# Patient Record
Sex: Male | Born: 1965 | Race: White | Hispanic: No | Marital: Married | Smoking: Never smoker
Health system: Southern US, Community
[De-identification: ages and names within clinical notes are randomized; demographics above are authoritative.]

## PROBLEM LIST (undated history)

## (undated) DIAGNOSIS — R51 Headache: Secondary | ICD-10-CM

## (undated) DIAGNOSIS — C4491 Basal cell carcinoma of skin, unspecified: Secondary | ICD-10-CM

## (undated) DIAGNOSIS — F419 Anxiety disorder, unspecified: Secondary | ICD-10-CM

## (undated) DIAGNOSIS — D1802 Hemangioma of intracranial structures: Secondary | ICD-10-CM

## (undated) HISTORY — DX: Anxiety disorder, unspecified: F41.9

## (undated) HISTORY — DX: Basal cell carcinoma of skin, unspecified: C44.91

## (undated) HISTORY — PX: SKIN CANCER EXCISION: SHX779

## (undated) HISTORY — DX: Hemangioma of intracranial structures: D18.02

## (undated) HISTORY — DX: Headache: R51

---

## 2008-05-18 ENCOUNTER — Encounter: Admission: RE | Admit: 2008-05-18 | Discharge: 2008-05-18 | Payer: Self-pay | Admitting: Orthopedic Surgery

## 2010-03-22 ENCOUNTER — Emergency Department (HOSPITAL_COMMUNITY): Admission: EM | Admit: 2010-03-22 | Discharge: 2010-03-22 | Payer: Self-pay | Admitting: Family Medicine

## 2011-07-06 ENCOUNTER — Observation Stay (HOSPITAL_COMMUNITY)
Admission: EM | Admit: 2011-07-06 | Discharge: 2011-07-06 | Disposition: A | Discharge status: Home / Self Care | Payer: BC Managed Care – PPO | Source: Ambulatory Visit | Attending: Emergency Medicine | Admitting: Emergency Medicine

## 2011-07-06 ENCOUNTER — Observation Stay (HOSPITAL_COMMUNITY): Payer: BC Managed Care – PPO

## 2011-07-06 ENCOUNTER — Emergency Department (HOSPITAL_COMMUNITY): Payer: BC Managed Care – PPO

## 2011-07-06 ENCOUNTER — Encounter (HOSPITAL_COMMUNITY): Payer: Self-pay | Admitting: Radiology

## 2011-07-06 DIAGNOSIS — R079 Chest pain, unspecified: Principal | ICD-10-CM | POA: Insufficient documentation

## 2011-07-06 LAB — CBC
HCT: 40.5 % (ref 39.0–52.0)
Hemoglobin: 14.3 g/dL (ref 13.0–17.0)
MCHC: 35.3 g/dL (ref 30.0–36.0)
RBC: 4.62 MIL/uL (ref 4.22–5.81)
WBC: 5.8 10*3/uL (ref 4.0–10.5)

## 2011-07-06 LAB — BASIC METABOLIC PANEL
BUN: 20 mg/dL (ref 6–23)
CO2: 27 mEq/L (ref 19–32)
Chloride: 104 mEq/L (ref 96–112)
Glucose, Bld: 110 mg/dL — ABNORMAL HIGH (ref 70–99)
Potassium: 3.9 mEq/L (ref 3.5–5.1)
Sodium: 138 mEq/L (ref 135–145)

## 2011-07-06 LAB — POCT I-STAT TROPONIN I: Troponin i, poc: 0.01 ng/mL (ref 0.00–0.08)

## 2011-07-06 MED ORDER — IOHEXOL 350 MG/ML SOLN
80.0000 mL | Freq: Once | INTRAVENOUS | Status: AC | PRN
  Administered 2011-07-06: 80 mL via INTRAVENOUS

## 2013-08-20 ENCOUNTER — Emergency Department (HOSPITAL_COMMUNITY): Payer: BC Managed Care – PPO

## 2013-08-20 ENCOUNTER — Encounter (HOSPITAL_COMMUNITY): Payer: Self-pay | Admitting: Emergency Medicine

## 2013-08-20 ENCOUNTER — Other Ambulatory Visit: Payer: Self-pay

## 2013-08-20 ENCOUNTER — Emergency Department (HOSPITAL_COMMUNITY)
Admission: EM | Admit: 2013-08-20 | Discharge: 2013-08-20 | Disposition: A | Discharge status: Home / Self Care | Payer: BC Managed Care – PPO | Attending: Emergency Medicine | Admitting: Emergency Medicine

## 2013-08-20 DIAGNOSIS — R252 Cramp and spasm: Secondary | ICD-10-CM

## 2013-08-20 DIAGNOSIS — R51 Headache: Secondary | ICD-10-CM | POA: Insufficient documentation

## 2013-08-20 DIAGNOSIS — R519 Headache, unspecified: Secondary | ICD-10-CM

## 2013-08-20 DIAGNOSIS — D1802 Hemangioma of intracranial structures: Secondary | ICD-10-CM | POA: Insufficient documentation

## 2013-08-20 LAB — CBC
HCT: 42.4 % (ref 39.0–52.0)
Hemoglobin: 15.1 g/dL (ref 13.0–17.0)
MCH: 31.7 pg (ref 26.0–34.0)
MCHC: 35.6 g/dL (ref 30.0–36.0)
MCV: 89.1 fL (ref 78.0–100.0)
Platelets: 243 10*3/uL (ref 150–400)
RBC: 4.76 MIL/uL (ref 4.22–5.81)
RDW: 12.6 % (ref 11.5–15.5)
WBC: 5.4 10*3/uL (ref 4.0–10.5)

## 2013-08-20 LAB — POCT I-STAT, CHEM 8
BUN: 23 mg/dL (ref 6–23)
Calcium, Ion: 1.16 mmol/L (ref 1.12–1.23)
Chloride: 104 mEq/L (ref 96–112)
Creatinine, Ser: 1.2 mg/dL (ref 0.50–1.35)
Glucose, Bld: 107 mg/dL — ABNORMAL HIGH (ref 70–99)
HCT: 45 % (ref 39.0–52.0)
Hemoglobin: 15.3 g/dL (ref 13.0–17.0)
Potassium: 3.9 mEq/L (ref 3.5–5.1)
Sodium: 140 mEq/L (ref 135–145)
TCO2: 23 mmol/L (ref 0–100)

## 2013-08-20 LAB — GLUCOSE, CAPILLARY: Glucose-Capillary: 112 mg/dL — ABNORMAL HIGH (ref 70–99)

## 2013-08-20 LAB — BASIC METABOLIC PANEL
BUN: 23 mg/dL (ref 6–23)
CO2: 25 mEq/L (ref 19–32)
Calcium: 9.2 mg/dL (ref 8.4–10.5)
Chloride: 101 mEq/L (ref 96–112)
Creatinine, Ser: 0.96 mg/dL (ref 0.50–1.35)
GFR calc Af Amer: >90 mL/min (ref >90)
GFR calc non Af Amer: >90 mL/min (ref >90)
Glucose, Bld: 107 mg/dL — ABNORMAL HIGH (ref 70–99)
Potassium: 3.9 mEq/L (ref 3.5–5.1)
Sodium: 137 mEq/L (ref 135–145)

## 2013-08-20 LAB — MAGNESIUM: Magnesium: 2 mg/dL (ref 1.5–2.5)

## 2013-08-20 LAB — URINALYSIS, ROUTINE W REFLEX MICROSCOPIC
Bilirubin Urine: NEGATIVE
Glucose, UA: NEGATIVE mg/dL
Hgb urine dipstick: NEGATIVE
Ketones, ur: NEGATIVE mg/dL
Leukocytes, UA: NEGATIVE
Specific Gravity, Urine: 1.026 (ref 1.005–1.030)
Urobilinogen, UA: 0.2 mg/dL (ref 0.0–1.0)

## 2013-08-20 LAB — PHOSPHORUS: Phosphorus: 2.8 mg/dL (ref 2.3–4.6)

## 2013-08-20 MED ORDER — GADOBENATE DIMEGLUMINE 529 MG/ML IV SOLN
20.0000 mL | Freq: Once | INTRAVENOUS | Status: AC
  Administered 2013-08-20: 20 mL via INTRAVENOUS

## 2013-08-20 MED ORDER — SODIUM CHLORIDE 0.9 % IV BOLUS (SEPSIS)
1000.0000 mL | Freq: Once | INTRAVENOUS | Status: AC
  Administered 2013-08-20: 1000 mL via INTRAVENOUS

## 2013-08-20 MED ORDER — LORAZEPAM 2 MG/ML IJ SOLN
1.0000 mg | Freq: Once | INTRAMUSCULAR | Status: AC
  Administered 2013-08-20: 1 mg via INTRAVENOUS
  Filled 2013-08-20: qty 1

## 2013-08-20 MED ORDER — ACETAMINOPHEN 500 MG PO TABS
1000.0000 mg | ORAL_TABLET | Freq: Once | ORAL | Status: AC
  Administered 2013-08-20: 1000 mg via ORAL
  Filled 2013-08-20: qty 2

## 2013-08-20 NOTE — ED Notes (Signed)
MD at bedside. 

## 2013-08-20 NOTE — ED Provider Notes (Addendum)
CSN: [id number]     Arrival date & time 08/20/13  1308 History   First MD Initiated Contact with Patient 08/20/13 [phone]     Chief Complaint  Patient presents with  . Dizziness   (Consider location/radiation/quality/duration/timing/severity/associated sxs/prior Treatment) Patient is a 47 y.o. male presenting with headaches.  Headache Pain location:  Generalized Quality:  Dull Radiates to:  Does not radiate Pain severity now: mild. Onset quality:  Gradual Duration:  2 weeks Timing:  Intermittent Progression:  Unchanged Chronicity:  New Relieved by:  Nothing Worsened by:  Nothing tried Associated symptoms: sinus pressure and visual change   Associated symptoms: no abdominal pain, no congestion, no cough, no diarrhea, no fever, no nausea, no photophobia and no vomiting   Associated symptoms comment:  Muscle cramping.   History reviewed. No pertinent past medical history. History reviewed. No pertinent past surgical history. No family history on file. History  Substance Use Topics  . Smoking status: Never Smoker   . Smokeless tobacco: Not on file  . Alcohol Use: Yes    Review of Systems  Constitutional: Negative for fever.  HENT: Positive for sinus pressure. Negative for congestion.   Eyes: Negative for photophobia.  Respiratory: Negative for cough and shortness of breath.   Cardiovascular: Negative for chest pain.  Gastrointestinal: Negative for nausea, vomiting, abdominal pain and diarrhea.  Neurological: Positive for headaches.  All other systems reviewed and are negative.    Allergies  Review of patient's allergies indicates no known allergies.  Home Medications   Current Outpatient Rx  Name  Route  Sig  Dispense  Refill  . diphenhydrAMINE (SOMINEX) 25 MG tablet   Oral   Take 25 mg by mouth at bedtime as needed for allergies.         Marland Kitchen ibuprofen (ADVIL,MOTRIN) 200 MG tablet   Oral   Take 400 mg by mouth every 6 (six) hours as needed for pain.           BP 143/90  Pulse 90  Temp(Src) 98.3 F (36.8 C)  Resp 16  Ht 7\' 6"  (2.286 m)  Wt 198 lb (89.812 kg)  BMI 17.19 kg/m2  SpO2 100% Physical Exam  Nursing note and vitals reviewed. Constitutional: He is oriented to person, place, and time. He appears well-developed and well-nourished. No distress.  HENT:  Head: Normocephalic and atraumatic.  Mouth/Throat: Oropharynx is clear and moist.  Eyes: Conjunctivae are normal. Pupils are equal, round, and reactive to light. No scleral icterus.  Neck: Neck supple.  Cardiovascular: Normal rate, regular rhythm, normal heart sounds and intact distal pulses.   No murmur heard. Pulmonary/Chest: Effort normal and breath sounds normal. No stridor. No respiratory distress. He has no wheezes. He has no rales.  Abdominal: Soft. He exhibits no distension. There is no tenderness. There is no rebound and no guarding.  Musculoskeletal: Normal range of motion. He exhibits no edema.  Neurological: He is alert and oriented to person, place, and time. No cranial nerve deficit or sensory deficit. He displays a negative Romberg sign. Coordination and gait normal. GCS eye subscore is 4. GCS verbal subscore is 5. GCS motor subscore is 6.  Reflex Scores:      Patellar reflexes are 2+ on the right side and 2+ on the left side. Skin: Skin is warm and dry. No rash noted.  Psychiatric: He has a normal mood and affect. His behavior is normal.    ED Course  Procedures (including critical care time) Labs Review Labs Reviewed  BASIC METABOLIC PANEL - Abnormal; Notable for the following:    Glucose, Bld 107 (*)    All other components within normal limits  GLUCOSE, CAPILLARY - Abnormal; Notable for the following:    Glucose-Capillary 112 (*)    All other components within normal limits  POCT I-STAT, CHEM 8 - Abnormal; Notable for the following:    Glucose, Bld 107 (*)    All other components within normal limits  CBC  URINALYSIS, ROUTINE W REFLEX MICROSCOPIC   MAGNESIUM  PHOSPHORUS   Imaging Review Dg Chest 2 View  08/20/2013   CLINICAL DATA:  Shortness of breath, dizziness  EXAM: CHEST  2 VIEW  COMPARISON:  None.  FINDINGS: Lungs are clear. No pleural effusion or pneumothorax.  The heart is normal in size.  Visualized osseous structures are within normal limits.  IMPRESSION: No evidence of acute cardiopulmonary disease.   Electronically Signed   By: Charline Bills M.D.   On: 08/20/2013 11:25   Ct Head Wo Contrast  08/20/2013   CLINICAL DATA:  Shortness of breath, cramping, dizziness, blurred vision, headache, symptoms for 1 week  EXAM: CT HEAD WITHOUT CONTRAST  TECHNIQUE: Contiguous axial images were obtained from the base of the skull through the vertex without intravenous contrast.  COMPARISON:  None  FINDINGS: Cavum septum pellucidum, normal variant.  Otherwise normal ventricular morphology.  No midline shift or mass effect.  Tiny foci of intermediate to high attenuation are identified in the right frontal lobe, largest 3 mm diameter image 20.  These are indeterminate and could represent tiny dystrophic calcifications or tiny foci of hemorrhage.  No additional intracranial hemorrhage, mass lesion or evidence acute infarction identified.  No extra-axial fluid collections.  Bones and sinuses unremarkable.  IMPRESSION: Tiny foci of intermediate high attenuation in right frontal lobe up to 3 mm in size, nonspecific, could represent foci of dystrophic calcification or hemorrhage.  Recommend MR imaging with and without contrast for further evaluation.  Findings discussed with Dr. Loretha Stapler on 08/20/2013 at 1159 hr.   Electronically Signed   By: Ulyses Southward M.D.   On: 08/20/2013 12:00   Mr Laqueta Jean WU Contrast  08/20/2013   CLINICAL DATA:  Cramping of the arms and legs. Blurred vision. Headache. Abnormal head CT.  EXAM: MRI HEAD WITHOUT AND WITH CONTRAST  TECHNIQUE: Multiplanar, multiecho pulse sequences of the brain and surrounding structures were obtained  according to standard protocol without and with intravenous contrast  CONTRAST:  20mL MULTIHANCE GADOBENATE DIMEGLUMINE 529 MG/ML IV SOLN  COMPARISON:  Head CT same day  FINDINGS: Diffusion imaging does not show any acute or subacute infarction. The brainstem and cerebellum are normal. The left cerebral hemisphere is normal.  In the right frontal region, the patient has a fairly large developmental venous anomaly, most consistent with venous angioma. The areas of hyperdensity on CT are within that region and could actually represent blood in some of the larger venous channels rather than parenchymal hemorrhage or calcification. Gradient echo imaging does not show evidence of old or acute parenchymal hemorrhage or calcification.  There is no mass lesion, hydrocephalus or extra-axial collection. The pituitary gland is normal. Sinuses, middle ears and mastoids are clear.  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 I believe the CT findings relate to hyperdensity related to some of the larger venous channels rather than acute intraparenchymal hemorrhage. The MRI scan does not show evidence of intraparenchymal hemorrhage, calcification or thrombosis  of the venous channels. Therefore, this finding may be incidental to the patient's presentation. For thorough FOLLOW UP, one might consider repeat CT scan in 2-3 months to see if the appearance has changed.   Electronically Signed   By: Paulina Fusi M.D.   On: 08/20/2013 14:13  All radiology studies independently viewed by me.     EKG Interpretation   None     EKG - NSR, rate 90, normal axis, normal intervals, no ST/T changes, no significant changes from prior.   MDM   1. Headache   2. Muscle cramps   3. Venous angioma of brain    47 year old male presenting with one week worth of intermittent headaches, blurry vision, and muscle cramping. Muscle cramps severe this morning which  prompted his ED visit.  Normal neurologic exam. Visual acuity within normal limits, grossly equivalent.  CT prompted the MRI, which should be venous angiomas. Remainder of his workup was unremarkable. He felt better after IV fluids, Ativan, Tylenol.  I think his symptoms are most likely stress related, but he will need further outpatient followup.  Will also refer to neurology for followup of his angiomas.    Candyce Churn, MD 08/20/13 1536  Candyce Churn, MD 08/20/13 [phone]

## 2013-08-20 NOTE — ED Notes (Signed)
Patient transported to MRI 

## 2013-08-20 NOTE — ED Notes (Addendum)
Pt states cramping up arms and legs, with intermittent blurry vision. Headache pain all over, denies N/V. Dizziness for the last week which as gotten worse. Denies history of vertigo. Feels a little SOB.

## 2013-08-20 NOTE — ED Notes (Signed)
[phone] Silva Bandy (wife)

## 2013-08-20 NOTE — ED Notes (Signed)
Pt transported to radiology.

## 2013-08-20 NOTE — ED Notes (Signed)
Pt expressed anxiety about being in the closed space MRI.

## 2013-08-20 NOTE — ED Notes (Signed)
Pt returned from radiology.

## 2013-08-20 NOTE — ED Notes (Signed)
Sob and dizzy this am states has been crampy the last week  Some blurred vision and h/a x a week or more

## 2013-08-20 NOTE — ED Notes (Signed)
Pt back from CT

## 2013-08-21 ENCOUNTER — Telehealth: Payer: Self-pay | Admitting: General Practice

## 2013-08-21 NOTE — Telephone Encounter (Signed)
Yes

## 2013-08-21 NOTE — Telephone Encounter (Signed)
Pt was discharged from the hospital last night, with instructions to follow up with his PCP immediately. He states that he needs a referral to see a neurologist. He has not seen Dr. Cato Mulligan in over 3 years, would you be willing to work him in for a post hosp or a new pt appt? Please advise.

## 2013-08-22 ENCOUNTER — Encounter (HOSPITAL_COMMUNITY): Payer: Self-pay | Admitting: Emergency Medicine

## 2013-08-22 ENCOUNTER — Emergency Department (HOSPITAL_COMMUNITY)
Admission: EM | Admit: 2013-08-22 | Discharge: 2013-08-22 | Disposition: A | Discharge status: Home / Self Care | Payer: BC Managed Care – PPO | Attending: Emergency Medicine | Admitting: Emergency Medicine

## 2013-08-22 ENCOUNTER — Emergency Department (HOSPITAL_COMMUNITY)
Admission: EM | Admit: 2013-08-22 | Discharge: 2013-08-22 | Disposition: A | Discharge status: Home / Self Care | Payer: BC Managed Care – PPO | Source: Home / Self Care

## 2013-08-22 ENCOUNTER — Emergency Department (HOSPITAL_COMMUNITY): Payer: BC Managed Care – PPO

## 2013-08-22 DIAGNOSIS — R51 Headache: Secondary | ICD-10-CM | POA: Insufficient documentation

## 2013-08-22 DIAGNOSIS — R519 Headache, unspecified: Secondary | ICD-10-CM

## 2013-08-22 DIAGNOSIS — D1802 Hemangioma of intracranial structures: Secondary | ICD-10-CM | POA: Insufficient documentation

## 2013-08-22 DIAGNOSIS — F411 Generalized anxiety disorder: Secondary | ICD-10-CM | POA: Insufficient documentation

## 2013-08-22 DIAGNOSIS — F419 Anxiety disorder, unspecified: Secondary | ICD-10-CM

## 2013-08-22 LAB — BASIC METABOLIC PANEL
CO2: 24 mEq/L (ref 19–32)
Chloride: 101 mEq/L (ref 96–112)
Creatinine, Ser: 1.05 mg/dL (ref 0.50–1.35)
GFR calc Af Amer: >90 mL/min (ref >90)
Glucose, Bld: 90 mg/dL (ref 70–99)
Potassium: 3.8 mEq/L (ref 3.5–5.1)
Sodium: 137 mEq/L (ref 135–145)

## 2013-08-22 MED ORDER — SODIUM CHLORIDE 0.9 % IV BOLUS (SEPSIS)
1000.0000 mL | Freq: Once | INTRAVENOUS | Status: AC
  Administered 2013-08-22: 1000 mL via INTRAVENOUS

## 2013-08-22 MED ORDER — DIPHENHYDRAMINE HCL 50 MG/ML IJ SOLN
25.0000 mg | Freq: Once | INTRAMUSCULAR | Status: AC
  Administered 2013-08-22: 25 mg via INTRAVENOUS
  Filled 2013-08-22: qty 1

## 2013-08-22 MED ORDER — METOCLOPRAMIDE HCL 5 MG/ML IJ SOLN
10.0000 mg | Freq: Once | INTRAMUSCULAR | Status: AC
  Administered 2013-08-22: 10 mg via INTRAVENOUS
  Filled 2013-08-22: qty 2

## 2013-08-22 MED ORDER — LORAZEPAM 2 MG/ML IJ SOLN
1.0000 mg | Freq: Once | INTRAMUSCULAR | Status: AC
  Administered 2013-08-22: 1 mg via INTRAVENOUS
  Filled 2013-08-22: qty 1

## 2013-08-22 MED ORDER — SODIUM CHLORIDE 0.9 % IV SOLN: INTRAVENOUS | Status: DC

## 2013-08-22 MED ORDER — HYDROCODONE-ACETAMINOPHEN 5-325 MG PO TABS: 2.0000 | ORAL_TABLET | ORAL | Status: DC | PRN

## 2013-08-22 MED ORDER — ALPRAZOLAM 0.25 MG PO TABS: 0.2500 mg | ORAL_TABLET | Freq: Three times a day (TID) | ORAL | Status: AC | PRN

## 2013-08-22 NOTE — ED Notes (Signed)
Family at bedside. 

## 2013-08-22 NOTE — ED Provider Notes (Signed)
CSN: [id number]     Arrival date & time 08/22/13  1305 History   First MD Initiated Contact with Patient 08/22/13 1325     Chief Complaint  Patient presents with  . Headache   (Consider location/radiation/quality/duration/timing/severity/associated sxs/prior Treatment) Patient is a 47 y.o. male presenting with headaches. The history is provided by the patient and a parent.  Headache  patient here complaining of headache that began 3 days ago. Seen here in the department for same and had a head CT followed by brain MRI which showed a right-sided venous angioma without evidence of hemorrhage. Patient denies any seizure activity but does note shaking of his upper and lower limbs. He was conscious throughout his entire episodes and states that he has been more anxious since the diagnosis. This was collaborated by his mother. Denies any fever or neck pain. Denies any nausea vomiting. Denies any vision loss. Has used Motrin without relief. Denies any syncopal or near syncopal events. Has not followup with the neurologist yet as he was instructed  History reviewed. No pertinent past medical history. History reviewed. No pertinent past surgical history. No family history on file. History  Substance Use Topics  . Smoking status: Never Smoker   . Smokeless tobacco: Not on file  . Alcohol Use: Yes    Review of Systems  Neurological: Positive for headaches.  All other systems reviewed and are negative.    Allergies  Review of patient's allergies indicates no known allergies.  Home Medications   Current Outpatient Rx  Name  Route  Sig  Dispense  Refill  . diphenhydrAMINE (SOMINEX) 25 MG tablet   Oral   Take 25 mg by mouth at bedtime as needed for allergies.         Marland Kitchen ibuprofen (ADVIL,MOTRIN) 200 MG tablet   Oral   Take 400 mg by mouth every 6 (six) hours as needed for pain.          BP 132/79  Pulse 80  Temp(Src) 98.2 F (36.8 C) (Oral)  Resp 16  SpO2 97% Physical Exam   Nursing note and vitals reviewed. Constitutional: He is oriented to person, place, and time. He appears well-developed and well-nourished.  Non-toxic appearance. No distress.  HENT:  Head: Normocephalic and atraumatic.  Eyes: Conjunctivae, EOM and lids are normal. Pupils are equal, round, and reactive to light.  Neck: Normal range of motion. Neck supple. No tracheal deviation present. No mass present.  Cardiovascular: Normal rate, regular rhythm and normal heart sounds.  Exam reveals no gallop.   No murmur heard. Pulmonary/Chest: Effort normal and breath sounds normal. No stridor. No respiratory distress. He has no decreased breath sounds. He has no wheezes. He has no rhonchi. He has no rales.  Abdominal: Soft. Normal appearance and bowel sounds are normal. He exhibits no distension. There is no tenderness. There is no rebound and no CVA tenderness.  Musculoskeletal: Normal range of motion. He exhibits no edema and no tenderness.  Neurological: He is alert and oriented to person, place, and time. He has normal strength. No cranial nerve deficit or sensory deficit. GCS eye subscore is 4. GCS verbal subscore is 5. GCS motor subscore is 6.  Skin: Skin is warm and dry. No abrasion and no rash noted.  Psychiatric: His speech is normal and behavior is normal. His mood appears anxious.    ED Course  Procedures (including critical care time) Labs Review Labs Reviewed  BASIC METABOLIC PANEL   Imaging Review Mr Lodema Pilot  Contrast  08/20/2013   CLINICAL DATA:  Cramping of the arms and legs. Blurred vision. Headache. Abnormal head CT.  EXAM: MRI HEAD WITHOUT AND WITH CONTRAST  TECHNIQUE: Multiplanar, multiecho pulse sequences of the brain and surrounding structures were obtained according to standard protocol without and with intravenous contrast  CONTRAST:  20mL MULTIHANCE GADOBENATE DIMEGLUMINE 529 MG/ML IV SOLN  COMPARISON:  Head CT same day  FINDINGS: Diffusion imaging does not show any acute or  subacute infarction. The brainstem and cerebellum are normal. The left cerebral hemisphere is normal.  In the right frontal region, the patient has a fairly large developmental venous anomaly, most consistent with venous angioma. The areas of hyperdensity on CT are within that region and could actually represent blood in some of the larger venous channels rather than parenchymal hemorrhage or calcification. Gradient echo imaging does not show evidence of old or acute parenchymal hemorrhage or calcification.  There is no mass lesion, hydrocephalus or extra-axial collection. The pituitary gland is normal. Sinuses, middle ears and mastoids are clear.  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 I believe the CT findings relate to hyperdensity related to some of the larger venous channels rather than acute intraparenchymal hemorrhage. The MRI scan does not show evidence of intraparenchymal hemorrhage, calcification or thrombosis of the venous channels. Therefore, this finding may be incidental to the patient's presentation. For thorough FOLLOW UP, one might consider repeat CT scan in 2-3 months to see if the appearance has changed.   Electronically Signed   By: Paulina Fusi M.D.   On: 08/20/2013 14:13    EKG Interpretation   None       MDM  No diagnosis found. Patient given medications for his headache here and he feels better. Neurological assessment stable. Repeat head CT remains negative. Patient will be given neurology referral    Toy Baker, MD 08/22/13 1451

## 2013-08-22 NOTE — ED Notes (Signed)
Patient transported to CT 

## 2013-08-22 NOTE — ED Notes (Signed)
Pt. Stated, i started having a bad headache on Thursday and pain on my left lower back with my legs a little numb.  Came to the hospital on Thursday and they said I had some spots in my brain and i need to see a nuro Dr.  Last night I had some shaking in my arms like a seizure but not and I have a bad headache.

## 2013-08-25 ENCOUNTER — Ambulatory Visit (INDEPENDENT_AMBULATORY_CARE_PROVIDER_SITE_OTHER): Payer: BC Managed Care – PPO | Admitting: Neurology

## 2013-08-25 ENCOUNTER — Encounter: Payer: Self-pay | Admitting: Neurology

## 2013-08-25 VITALS — BP 129/85 | HR 106 | Ht 76.0 in | Wt 204.0 lb

## 2013-08-25 DIAGNOSIS — D1802 Hemangioma of intracranial structures: Secondary | ICD-10-CM

## 2013-08-25 DIAGNOSIS — R51 Headache: Secondary | ICD-10-CM

## 2013-08-25 DIAGNOSIS — R519 Headache, unspecified: Secondary | ICD-10-CM | POA: Insufficient documentation

## 2013-08-25 HISTORY — DX: Headache: R51

## 2013-08-25 HISTORY — DX: Hemangioma of intracranial structures: D18.02

## 2013-08-25 MED ORDER — PREDNISONE 5 MG PO TABS: ORAL_TABLET | ORAL | Status: DC

## 2013-08-25 NOTE — Progress Notes (Signed)
Reason for visit: Headache  Robert Elliott. is a 47 y.o. male  History of present illness:  Robert Elliott is a 47 year old right-handed white male with a history of a headache that began on 08/20/2013. The patient has noted a headache in the left occipital area, and he indicates that the pain began in the head, and then eventually spread into the left neck area. The patient denies any pain down into the shoulder or arm. The patient has a sensation of increased tension of both arms, without true weakness. The patient has had some blurring of vision with the headache, but he denies photophobia or phonophobia. The patient has not had any visual loss or problems with nausea or vomiting. The patient denies balance issues or problems controlling the bowels or the bladder. The patient does report some neck stiffness. The patient went to the emergency room, and a CT scan of brain showed a right frontal abnormality, and MRI evaluation was then done. This shows evidence of a right frontal venous angioma without evidence of acute or chronic hemorrhage. The patient had some problems with tremors of both arms lasting 20-30 minutes the day after he went to the emergency room on October 9. The patient has returned to the emergency room again on 08/22/2013, and he received medications for the headache. The patient is sent to this office for an evaluation. The patient indicates that Advil, Xanax, and hydrocodone do help the headache. The headache persists, but the patient is able to function with the headache. The patient describes the headache as a dull achy pain, without scalp tenderness. The patient denies any history of frequent headaches previously.  Past Medical History  Diagnosis Date  . Headache(784.0) 08/25/2013  . Venous angioma of brain 08/25/2013    Right frontal    History reviewed. No pertinent past surgical history.  History reviewed. No pertinent family history.  Social history:  reports that he has  never smoked. He has never used smokeless tobacco. He reports that he drinks alcohol. He reports that he does not use illicit drugs.  Medications:  Current Outpatient Prescriptions on File Prior to Visit  Medication Sig Dispense Refill  . ALPRAZolam (XANAX) 0.25 MG tablet Take 1 tablet (0.25 mg total) by mouth 3 (three) times daily as needed for anxiety.  12 tablet  0  . HYDROcodone-acetaminophen (NORCO/VICODIN) 5-325 MG per tablet Take 2 tablets by mouth every 4 (four) hours as needed for pain.  10 tablet  0  . ibuprofen (ADVIL,MOTRIN) 200 MG tablet Take 400 mg by mouth every 6 (six) hours as needed for pain.       No current facility-administered medications on file prior to visit.     No Known Allergies  ROS:  Out of a complete 14 system review of symptoms, the patient complains only of the following symptoms, and all other reviewed systems are negative.  Loss of vision, blurred vision Muscle cramps, achy muscles Headache  Blood pressure 129/85, pulse 106, height 6\' 4"  (1.93 m), weight 204 lb (92.534 kg).  Physical Exam  General: The patient is alert and cooperative at the time of the examination.  Head: Pupils are equal, round, and reactive to light. Discs are flat bilaterally.  Neck: The neck is supple, no carotid bruits are noted.  Respiratory: The respiratory examination is clear.  Cardiovascular: The cardiovascular examination reveals a regular rate and rhythm, no obvious murmurs or rubs are noted.  Neuromuscular: Range of movement of the cervical spine is full  and normal.  Skin: Extremities are without significant edema.  Neurologic Exam  Mental status:  Cranial nerves: Facial symmetry is present. There is good sensation of the face to pinprick and soft touch bilaterally. The strength of the facial muscles and the muscles to head turning and shoulder shrug are normal bilaterally. Speech is well enunciated, no aphasia or dysarthria is noted. Extraocular movements  are full. Visual fields are full.  Motor: The motor testing reveals 5 over 5 strength of all 4 extremities. Good symmetric motor tone is noted throughout.  Sensory: Sensory testing is intact to pinprick, soft touch, vibration sensation, and position sense on all 4 extremities. No evidence of extinction is noted.  Coordination: Cerebellar testing reveals good finger-nose-finger and heel-to-shin bilaterally.  Gait and station: Gait is normal. Tandem gait is normal. Romberg is negative. No drift is seen.  Reflexes: Deep tendon reflexes are symmetric and normal bilaterally. Toes are downgoing bilaterally.   Assessment/Plan:  1. Left occipital headache  2. Right frontal venous angioma  The patient has a normal clinical examination. MRI of the brain has not shown evidence of hemorrhage associated with a venous angioma. In general, these structures do not usually cause clinical symptoms, but very occasionally, they can hemorrhage or cause seizures. The patient appears to have a high anxiety level associated with the headache and the venous angioma. The patient will be placed on a prednisone Dosepak to see if the headache can be controlled. If not, the patient will be placed into neuromuscular therapy, and a muscle relaxants will be added to the regimen. The patient may require more alprazolam for the underlying anxiety issues. The patient will followup in 3 months. If the neck pain persists, MRI of the cervical spine may be done in the distant future.  Marlan Palau MD 08/25/2013 5:29 PM  [facility] [address] [place], [place] [address]  Phone [phone] Fax [phone]

## 2013-08-26 NOTE — Telephone Encounter (Signed)
Yes but no availability today.

## 2013-08-26 NOTE — Telephone Encounter (Signed)
Pt is establishing w/ another provider.

## 2013-08-26 NOTE — Telephone Encounter (Signed)
Lmovm/ ga °

## 2013-11-27 ENCOUNTER — Ambulatory Visit: Payer: Self-pay | Admitting: Neurology

## 2014-03-23 ENCOUNTER — Encounter: Payer: Self-pay | Admitting: Neurology

## 2014-05-13 ENCOUNTER — Ambulatory Visit: Payer: Self-pay | Admitting: Neurology

## 2015-03-14 ENCOUNTER — Ambulatory Visit (INDEPENDENT_AMBULATORY_CARE_PROVIDER_SITE_OTHER): Payer: 59 | Admitting: Neurology

## 2015-03-14 ENCOUNTER — Encounter: Payer: Self-pay | Admitting: Neurology

## 2015-03-14 VITALS — BP 127/88 | HR 70 | Resp 16 | Ht 76.0 in | Wt 210.0 lb

## 2015-03-14 DIAGNOSIS — R51 Headache: Secondary | ICD-10-CM

## 2015-03-14 DIAGNOSIS — G4719 Other hypersomnia: Secondary | ICD-10-CM

## 2015-03-14 DIAGNOSIS — R519 Headache, unspecified: Secondary | ICD-10-CM

## 2015-03-14 DIAGNOSIS — D1802 Hemangioma of intracranial structures: Secondary | ICD-10-CM | POA: Diagnosis not present

## 2015-03-14 DIAGNOSIS — R0683 Snoring: Secondary | ICD-10-CM | POA: Diagnosis not present

## 2015-03-14 NOTE — Patient Instructions (Signed)

## 2015-03-14 NOTE — Progress Notes (Signed)
Subjective:    Patient ID: Robert Elliott. is a 49 y.o. male.  HPI     Star Age, MD, PhD [facility] [address] [address] [place], [place] [address]  Dear Dr. Brigitte Pulse,  I saw your patient, Robert Elliott, upon your kind request in my neurologic clinic today for initial consultation of his sleep disorder, in particular, concern for underlying obstructive sleep apnea in the context of recurrent headaches and morning headaches reported as well as snoring and daytime tiredness. The patient is unaccompanied today. As you know, Mr. Robert Elliott is a 49 year old right-handed gentleman with an underlying medical history of anxiety, venous anomaly of the brain, and recurrent headaches since May 2014, who reports snoring and waking up with headaches often. He was seen by my colleague, Dr. Jannifer Franklin in October 2014. 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 I believe the CT findings relate to hyperdensity related to some of the larger venous channels rather than acute intraparenchymal hemorrhage. The MRI scan does not show evidence of intraparenchymal hemorrhage, calcification or thrombosis of the venous channels. Therefore, this finding may be incidental to the patient's presentation. For thorough FOLLOW UP, one might consider repeat CT scan in 2-3 months to see if the appearance has changed.   In addition, personally reviewed the images through the PACS system and agree with the findings. 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 He also had a head CT on 08/20/13, which I reviewed:  Tiny foci of intermediate high attenuation in right frontal lobe up to 3 mm in size, nonspecific, could represent foci of dystrophic calcification or hemorrhage. Recommend MR imaging with and without  contrast for further evaluation.   He also had a repeat head CT without contrast on 08/22/2013, which I reviewed: 1. No acute intracranial abnormalities. No change from the head CT and brain MRI dated 08/20/2013. 2. Small areas of calcification in the right frontal lobe corresponds to a venous angioma. This is a chronic finding. 3. Right cerebellar tonsillar ectopia, borderline Chiari 1 malformation.  In addition, personally reviewed the images through the PACS system. He takes Xanax as needed for anxiety. He does not take it every day. In fact, he may go days and sometimes weeks without taking it. He has a high stress job. He has to travel quite a bit especially in the first quarter of the year, usually nationwide. His bedtime varies but may be around 10 or 11 PM. Sometimes he works on his computer in bed. He does not sleep in the same bed with his wife. They have maintained separate bedrooms for years. His rise time is between 6 and 6:30. He wakes up adequately rested on most days. He denies restless leg symptoms, nighttime reflux symptoms or night to night nocturia. He is not known to twitch in his sleep. His snoring is probably mild to moderate. He is not sure if he stops breathing in his sleep. He has some daytime tiredness and his Epworth sleepiness score is 8 out of 24. He has 2 children, ages 64 and 28. He does not smoke. He drinks caffeine occasionally, not daily. He drinks alcohol maybe 2 or 3 times per week. He has a family history of obstructive sleep apnea in both his younger brothers. His father snored heavily. He  died at the age of 2 secondary to suicide secondary to significant depression. His mother is approximately 32 years old. He does not typically watch TV in bed. He denies parasomnias. He has no one-sided weakness, numbness, tingling or slurring of speech or droopy face. He does not have significant nausea or vomiting with his headaches. He does suffer from tension headaches for  which he occasionally takes Flexeril. He was seen by a headache specialist as well and was prescribed medication but did not actually take it.   I reviewed your office note from 02/16/2015, which you kindly included. Also reviewed blood test results that were done in your office on 01/18/2015: He had a normal CBC with differential, normal CMP, and normal TSH. He also had blood work more recently and has an appointment for his yearly physical with you coming up.   Her Past Medical History Is Significant For: Past Medical History  Diagnosis Date  . Headache(784.0) 08/25/2013  . 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 Anxiety disorder     His Past Surgical History Is Significant For: No past surgical history on file.  His Family History Is Significant For: Family History  Problem Relation Age of Onset  . Depression Father   . Congestive Heart Failure Maternal Grandmother   . Diabetes Maternal Grandfather   . Heart attack Maternal Grandfather   . Depression Paternal Grandfather     His Social History Is Significant For: History   Social History  . Marital Status: Married    Spouse Name: N/A  . Number of Children: 2  . Years of Education: college   Occupational History  . Sales    Social History Main Topics  . Smoking status: Never Smoker   . Smokeless tobacco: Never Used  . Alcohol Use: 0.0 oz/week    0 Standard drinks or equivalent per week     Comment: 2x weekly  . Drug Use: No  . Sexual Activity: Not on file   Other Topics Concern  . None   Social History Narrative   Occasional caffeine drinks    His Allergies Are:  No Known Allergies:   His Current Medications Are:  Outpatient Encounter Prescriptions as of 03/14/2015  . Order #: [id number] Class: Print  . [DISCONTINUED] Order #: [id number] Class: Historical Med  . [DISCONTINUED] Order #: [id number] Class: Historical Med  :  Review of Systems:  Out of a complete 14 point review of systems, all  are reviewed and negative with the exception of these symptoms as listed below:  Review of Systems  Eyes:       Change in vision with nearsight   Neurological: Positive for headaches.       Snoring, No witnessed apnea, Excessive daytime sleepiness, Sometimes wakes in the morning feeling tired, falls asleep during daytime activities.   Psychiatric/Behavioral:       Anxiety    Objective:  Neurologic Exam  Physical Exam Physical Examination:   Filed Vitals:   03/14/15 1012  BP: 127/88  Pulse: 70  Resp: 16    General Examination: The patient is a very pleasant 49 y.o. male in no acute distress. He appears well-developed and well-nourished and well groomed.   HEENT: Normocephalic, atraumatic, pupils are equal, round and reactive to light and accommodation. Funduscopic exam is normal with sharp disc margins noted. Extraocular tracking is good without limitation to gaze excursion or nystagmus noted. Normal smooth pursuit is noted. Hearing is grossly intact. Tympanic membranes  are clear bilaterally. Face is symmetric with normal facial animation and normal facial sensation. Speech is clear with no dysarthria noted. There is no hypophonia. There is no lip, neck/head, jaw or voice tremor. Neck is supple with full range of passive and active motion. There are no carotid bruits on auscultation. Oropharynx exam reveals: mild mouth dryness, good dental hygiene and mild airway crowding, due to redundant soft palate and wider uvula which is also somewhat elongated and floppy appearing.. Mallampati is class II. Tongue protrudes centrally and palate elevates symmetrically. Tonsils are small bilaterally. Neck size is 15-1/2 inches. He has no significant overbite. Nasal inspection reveals no significant nasal mucosal bogginess or redness and no septal deviation.   Chest: Clear to auscultation without wheezing, rhonchi or crackles noted.  Heart: S1+S2+0, regular and normal without murmurs, rubs or gallops  noted.   Abdomen: Soft, non-tender and non-distended with normal bowel sounds appreciated on auscultation.  Extremities: There is no pitting edema in the distal lower extremities bilaterally. Pedal pulses are intact.  Skin: Warm and dry without trophic changes noted. There are no varicose veins.  Musculoskeletal: exam reveals no obvious joint deformities, tenderness or joint swelling or erythema.   Neurologically:  Mental status: The patient is awake, alert and oriented in all 4 spheres. His immediate and remote memory, attention, language skills and fund of knowledge are appropriate. There is no evidence of aphasia, agnosia, apraxia or anomia. Speech is clear with normal prosody and enunciation. Thought process is linear. Mood is normal and affect is normal.  Cranial nerves II - XII are as described above under HEENT exam. In addition: shoulder shrug is normal with equal shoulder height noted. Motor exam: Normal bulk, strength and tone is noted. There is no drift, tremor or rebound. Romberg is negative. Reflexes are 2+ throughout. Babinski: Toes are flexor bilaterally. Fine motor skills and coordination: intact with normal finger taps, normal hand movements, normal rapid alternating patting, normal foot taps and normal foot agility.  Cerebellar testing: No dysmetria or intention tremor on finger to nose testing. Heel to shin is unremarkable bilaterally. There is no truncal or gait ataxia.  Sensory exam: intact to light touch, pinprick, vibration, temperature sense in the upper and lower extremities.  Gait, station and balance: He stands easily. No veering to one side is noted. No leaning to one side is noted. Posture is age-appropriate and stance is narrow based. Gait shows normal stride length and normal pace. No problems turning are noted. He turns en bloc. Tandem walk is unremarkable.   Assessment and Plan:   In summary, Braelen Sproule. is a very pleasant 49 y.o.-year old male with an  underlying medical history of anxiety, venous anomaly of the brain, and recurrent headaches since May 2014, who reports snoring and waking up with headaches often. While he does not give a telltale history for obstructive sleep apnea, there are some concerning findings including snoring, waking up with headaches and a family history of obstructive sleep apnea, as well as a larger uvula and redundant soft palate noted on exam.  I had a long chat with the patient about my findings and the diagnosis of OSA, its prognosis and treatment options. We talked about medical treatments, surgical interventions and non-pharmacological approaches. I explained in particular the risks and ramifications of untreated moderate to severe OSA, especially with respect to developing cardiovascular disease down the Road, including congestive heart failure, difficult to treat hypertension, cardiac arrhythmias, or stroke. Even type 2 diabetes has, in  part, been linked to untreated OSA. Symptoms of untreated OSA include daytime sleepiness, memory problems, mood irritability and mood disorder such as depression and anxiety, lack of energy, as well as recurrent headaches, especially morning headaches. We talked about trying to maintain a healthy lifestyle in general, as well as the importance of weight control. I encouraged the patient to eat healthy, exercise daily and keep well hydrated, to keep a scheduled bedtime and wake time routine, to not skip any meals and eat healthy snacks in between meals. I advised the patient not to drive when feeling sleepy. I recommended the following at this time: sleep study with potential positive airway pressure titration. (We will score hypopneas at 4% and split the sleep study into diagnostic and treatment portion, if the estimated. 2 hour AHI is >20/h).   I explained the sleep test procedure to the patient and also outlined possible surgical and non-surgical treatment options of OSA, including the use  of a custom-made dental device (which would require a referral to a specialist dentist or oral surgeon), upper airway surgical options, such as pillar implants, radiofrequency surgery, tongue base surgery, and UPPP (which would involve a referral to an ENT surgeon). Rarely, jaw surgery such as mandibular advancement may be considered.  I also explained the CPAP treatment option to the patient, who indicated that he would be willing to try CPAP if the need arises. I explained the importance of being compliant with PAP treatment, not only for insurance purposes but primarily to improve His symptoms, and for the patient's long term health benefit, including to reduce His cardiovascular risks. I answered all his questions today and the patient was in agreement. I would like to see him back after the sleep study is completed and encouraged him to call with any interim questions, concerns, problems or updates.   Thank you very much for allowing me to participate in the care of this nice patient. If I can be of any further assistance to you please do not hesitate to call me at [phone].  Sincerely,   Star Age, MD, PhD

## 2015-03-29 ENCOUNTER — Telehealth: Payer: Self-pay | Admitting: Neurology

## 2015-03-29 DIAGNOSIS — G4719 Other hypersomnia: Secondary | ICD-10-CM

## 2015-03-29 DIAGNOSIS — R51 Headache: Secondary | ICD-10-CM

## 2015-03-29 DIAGNOSIS — R0683 Snoring: Secondary | ICD-10-CM

## 2015-03-29 DIAGNOSIS — R519 Headache, unspecified: Secondary | ICD-10-CM

## 2015-03-29 NOTE — Telephone Encounter (Signed)
When do you want me to get them on the phone for you?

## 2015-03-29 NOTE — Telephone Encounter (Signed)
This patient's insurance denied an attended sleep study. I will order home sleep test.  

## 2015-03-29 NOTE — Telephone Encounter (Signed)
[employer] contacted office to inform authorization request is denied for sleep study. For peer to peer review call [phone] auth reference# [id number]. Home sleep test has been requested based on [facility] clinical review. Please advise.

## 2015-06-06 ENCOUNTER — Encounter (INDEPENDENT_AMBULATORY_CARE_PROVIDER_SITE_OTHER): Payer: 59

## 2015-06-06 DIAGNOSIS — G4710 Hypersomnia, unspecified: Secondary | ICD-10-CM

## 2015-06-10 ENCOUNTER — Other Ambulatory Visit: Payer: Self-pay | Admitting: Internal Medicine

## 2015-06-10 DIAGNOSIS — R51 Headache: Secondary | ICD-10-CM

## 2015-06-10 DIAGNOSIS — D1802 Hemangioma of intracranial structures: Secondary | ICD-10-CM

## 2015-06-10 DIAGNOSIS — R519 Headache, unspecified: Secondary | ICD-10-CM

## 2015-06-13 ENCOUNTER — Telehealth: Payer: Self-pay | Admitting: Neurology

## 2015-06-13 NOTE — Telephone Encounter (Signed)
Pt called to get results of sleep study. Please call and advise [phone]

## 2015-06-13 NOTE — Telephone Encounter (Signed)
Has the patient had his study? Looks like last studies were cancelled.

## 2015-06-15 ENCOUNTER — Telehealth: Payer: Self-pay | Admitting: Neurology

## 2015-06-15 NOTE — Telephone Encounter (Signed)
Patient was calling to get results of HST. I left a message for him stating that as soon as I get them I will call him back.

## 2015-06-15 NOTE — Telephone Encounter (Signed)
Patient did have a HST and Shirlean Mylar would be the one to download the test.  Please follow up with Shirlean Mylar

## 2015-06-21 ENCOUNTER — Other Ambulatory Visit: Payer: Self-pay

## 2015-06-24 ENCOUNTER — Telehealth: Payer: Self-pay | Admitting: Neurology

## 2015-06-24 DIAGNOSIS — G4733 Obstructive sleep apnea (adult) (pediatric): Secondary | ICD-10-CM

## 2015-06-24 NOTE — Telephone Encounter (Signed)
Patient seen on 03/14/15, HST on 06/21/15.  Please call and notify the patient that the recent home sleep test did suggest the diagnosis of moderate obstructive sleep apnea and that I recommend treatment for this in the form of CPAP. I will request an overnight sleep study for proper titration and mask fitting. Please explain to patient and arrange for a CPAP titration study. I have placed an order in the chart. Thanks, and please route to [facility] for scheduling.   Star Age, MD, PhD [facility])

## 2015-06-27 NOTE — Telephone Encounter (Signed)
I gave the results of patient's sleep study. He would like to proceed with titration study.

## 2015-06-27 NOTE — Telephone Encounter (Signed)
I spoke to patient. He is aware of results and would like to do the titration study. PSG has been faxed to PCP.

## 2015-06-27 NOTE — Telephone Encounter (Signed)
Left message to call back for sleep study results.  

## 2015-07-04 ENCOUNTER — Telehealth: Payer: Self-pay | Admitting: Neurology

## 2015-07-04 ENCOUNTER — Ambulatory Visit
Admission: RE | Admit: 2015-07-04 | Discharge: 2015-07-04 | Disposition: A | Discharge status: Home / Self Care | Payer: 59 | Source: Ambulatory Visit | Attending: Internal Medicine | Admitting: Internal Medicine

## 2015-07-04 ENCOUNTER — Other Ambulatory Visit: Payer: Self-pay

## 2015-07-04 DIAGNOSIS — D1802 Hemangioma of intracranial structures: Secondary | ICD-10-CM

## 2015-07-04 DIAGNOSIS — R51 Headache: Secondary | ICD-10-CM

## 2015-07-04 DIAGNOSIS — G4733 Obstructive sleep apnea (adult) (pediatric): Secondary | ICD-10-CM

## 2015-07-04 DIAGNOSIS — R519 Headache, unspecified: Secondary | ICD-10-CM

## 2015-07-04 MED ORDER — GADOBENATE DIMEGLUMINE 529 MG/ML IV SOLN
20.0000 mL | Freq: Once | INTRAVENOUS | Status: AC | PRN
  Administered 2015-07-04: 20 mL via INTRAVENOUS

## 2015-07-04 NOTE — Telephone Encounter (Signed)
Do you want to do Peer to Peer or order AutoPAP?

## 2015-07-04 NOTE — Telephone Encounter (Signed)
Patient was denied by [facility] for CPAP titration.  Home apap was suggested.  Peer to Peer [phone] opt 3 using reference # [id number]

## 2015-07-04 NOTE — Telephone Encounter (Signed)
Beverlee Nims: We can go ahead and proceed with AutoPap therapy at home and I would like to see him in follow-up in about 8-10 weeks. AutoPap order placed in chart, please initiate referral to a DME company and also talk to patient. Thanks!

## 2015-07-05 NOTE — Telephone Encounter (Signed)
I spoke to patient. He is aware and willing to proceed with AutoPAP. I will send order to [facility]. He has [place] and lives in [place]. I will send him a letter reminding him to make f/u with Dr. Rexene Alberts and importance of compliance.

## 2015-07-13 ENCOUNTER — Telehealth: Payer: Self-pay | Admitting: Neurology

## 2015-07-13 NOTE — Telephone Encounter (Signed)
I will speak with Leafy Ro at [place]. She is aware that insurance would probably deny CPAP and [employer] would work a Agricultural consultant with patient.

## 2015-07-13 NOTE — Telephone Encounter (Signed)
CPAP denied by [facility]

## 2015-07-19 ENCOUNTER — Telehealth: Payer: Self-pay

## 2015-07-19 NOTE — Telephone Encounter (Signed)
[place], RN            He is willing to do a [employer] but he was going out of town for a couple of weeks and we are mailing him some info on models, pricing, etc. I will let you know what he decides:)   Leafy Ro at [place]     ----- Message -----   From: Laurence Spates, RN   Sent: 07/13/2015  5:04 PM    To: Berton Mount   We received the official denial from insurance for CPAP, which you would probably happen. Is [employer] able to work something out for the patient?  Leighton Parody RN

## 2016-03-08 DIAGNOSIS — R1012 Left upper quadrant pain: Secondary | ICD-10-CM | POA: Diagnosis not present

## 2016-03-08 DIAGNOSIS — R1084 Generalized abdominal pain: Secondary | ICD-10-CM | POA: Diagnosis not present

## 2016-03-08 DIAGNOSIS — Z6825 Body mass index (BMI) 25.0-25.9, adult: Secondary | ICD-10-CM | POA: Diagnosis not present

## 2016-03-16 DIAGNOSIS — Z125 Encounter for screening for malignant neoplasm of prostate: Secondary | ICD-10-CM | POA: Diagnosis not present

## 2016-03-16 DIAGNOSIS — Z0000 Encounter for general adult medical examination without abnormal findings: Secondary | ICD-10-CM | POA: Diagnosis not present

## 2016-03-22 DIAGNOSIS — G44209 Tension-type headache, unspecified, not intractable: Secondary | ICD-10-CM | POA: Diagnosis not present

## 2016-03-22 DIAGNOSIS — Z1389 Encounter for screening for other disorder: Secondary | ICD-10-CM | POA: Diagnosis not present

## 2016-03-22 DIAGNOSIS — M542 Cervicalgia: Secondary | ICD-10-CM | POA: Diagnosis not present

## 2016-03-22 DIAGNOSIS — F418 Other specified anxiety disorders: Secondary | ICD-10-CM | POA: Diagnosis not present

## 2016-03-22 DIAGNOSIS — R1012 Left upper quadrant pain: Secondary | ICD-10-CM | POA: Diagnosis not present

## 2016-03-22 DIAGNOSIS — Z0000 Encounter for general adult medical examination without abnormal findings: Secondary | ICD-10-CM | POA: Diagnosis not present

## 2016-04-16 DIAGNOSIS — Z6825 Body mass index (BMI) 25.0-25.9, adult: Secondary | ICD-10-CM | POA: Diagnosis not present

## 2016-04-16 DIAGNOSIS — M542 Cervicalgia: Secondary | ICD-10-CM | POA: Diagnosis not present

## 2016-04-16 DIAGNOSIS — D1800 Hemangioma unspecified site: Secondary | ICD-10-CM | POA: Diagnosis not present

## 2016-04-16 DIAGNOSIS — R209 Unspecified disturbances of skin sensation: Secondary | ICD-10-CM | POA: Diagnosis not present

## 2016-05-18 DIAGNOSIS — M542 Cervicalgia: Secondary | ICD-10-CM | POA: Diagnosis not present

## 2016-05-25 ENCOUNTER — Other Ambulatory Visit: Payer: Self-pay | Admitting: Internal Medicine

## 2016-05-25 DIAGNOSIS — R1012 Left upper quadrant pain: Secondary | ICD-10-CM

## 2016-05-30 ENCOUNTER — Other Ambulatory Visit: Payer: Self-pay

## 2016-06-13 ENCOUNTER — Encounter: Payer: Self-pay | Admitting: Neurology

## 2016-06-13 ENCOUNTER — Other Ambulatory Visit: Payer: Self-pay

## 2016-06-13 ENCOUNTER — Ambulatory Visit (INDEPENDENT_AMBULATORY_CARE_PROVIDER_SITE_OTHER): Payer: BLUE CROSS/BLUE SHIELD | Admitting: Neurology

## 2016-06-13 VITALS — BP 129/81 | HR 84 | Resp 18 | Ht 76.0 in | Wt 200.0 lb

## 2016-06-13 DIAGNOSIS — R51 Headache: Secondary | ICD-10-CM | POA: Diagnosis not present

## 2016-06-13 DIAGNOSIS — G4486 Cervicogenic headache: Secondary | ICD-10-CM

## 2016-06-13 DIAGNOSIS — R202 Paresthesia of skin: Secondary | ICD-10-CM | POA: Diagnosis not present

## 2016-06-13 DIAGNOSIS — R519 Headache, unspecified: Secondary | ICD-10-CM

## 2016-06-13 NOTE — Patient Instructions (Signed)
I recommend you try the Robaxin. Be aware, it can be sedating and impair your driving.   We will do a neck MRI and call you with the test results. We will have to schedule you for this on a separate date. This test requires authorization from your insurance, and we will take care of the insurance process.  Please try to get records from Dr. Audery Amel office, in particular, what medications you may have tried.   l

## 2016-06-13 NOTE — Progress Notes (Signed)
Subjective:    Patient ID: Robert Elliott. is a 50 y.o. male.  HPI    History:   Dear Dr. Brigitte Pulse,  I saw your patient, Robert Elliott, upon your kind request in my neurologic clinic today for initial consultation of his neck pain and complained of numbness on the left side and concern for left-sided eye droop. The patient is unaccompanied today. As you know, Robert Elliott is a 50 year old right-handed gentleman with an underlying medical history of venous anomaly of the brain, recurrent headaches, and anxiety, whom I have previously seen once before on 03/14/2015 for recurrent headaches and an underlying sleep disorder. Of note, I ordered a sleep study. This was denied by his insurance. He was approved for a home sleep test. He had this on 06/21/2015, demonstrating an AHI of 18.5, O2 nadir of 78%. I ordered a in-house CPAP titration study but this was denied by his insurance, he was advised to proceed with AutoPap therapy at home but as I understand he was never set up for AutoPap therapy.  Today, 06/13/2016: He reports headache that starts at the base of his skull on the left side, left-sided neck pain, neck muscle tension, some tingling of his left arm, sometimes an intermittent shooting pain, no sustained weakness, no constant numbness or constant tingling, headache can be hours, he tried over-the-counter medication. He is due to start physical therapy at some point soon but has not scheduled any appointments yet, he does have an appointment with ophthalmology at [facility] ophthalmology next month. He has not had an eye exam in at least 2 years. He tries to drink enough water. He opted not to pursue AutoPap therapy and has been working on weight loss, he has been exercising in the form of brisk walking about 5 miles per day. He has lost about 10 pounds since I saw him for his sleep study testing. He denies nausea, vomiting, photophobia. Pain quality can be sharp and shooting. Sometimes he feels the pain go  down his mid back and lower back on the left, sometimes radiating forward around his abdomen. He is due for a CT abdomen is understand, he has had pain in the abdomen.   I reviewed your office note from 05/28/2016. He was offered physical therapy referral but he declined. He had a brain MRI with and without contrast as well as a brain MRA on 07/04/2015: IMPRESSION: 1. 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 2. No definitive explanation for headache. 3. Negative intracranial MRA.   You prescribe Robaxin and Flexeril as needed. Flexaril made him sleepy and he has to drive quite a bit. He has not tried the Robaxin yet.   He had seen Dr. Jannifer Franklin some 3 years ago for similar issues and an MRI neck was discussed. He has seen Dr. Melton Alar before too, tried some medications, but does not recall which ones.   Previously:  03/14/2015: Robert Elliott is a 50 year old right-handed gentleman with an underlying medical history of anxiety, venous anomaly of the brain, and recurrent headaches since May 2014, who reports snoring and waking up with headaches often. He was seen by my colleague, Dr. Jannifer Franklin in October 2014. 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 I believe the CT findings relate to hyperdensity related to some of the larger venous channels rather than  acute intraparenchymal hemorrhage. The MRI scan does not show evidence of intraparenchymal hemorrhage, calcification or thrombosis of the venous channels. Therefore, this finding may be incidental to the patient's presentation. For thorough FOLLOW UP, one might consider repeat CT scan in 2-3 months to see if the appearance has changed.   In addition, personally reviewed the images through the PACS system and agree with the findings. 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 He also had a head CT on 08/20/13, which I reviewed:  Tiny foci of intermediate high attenuation in right frontal lobe up to 3 mm in size, nonspecific, could represent foci of dystrophic calcification or hemorrhage. Recommend MR imaging with and without contrast for further evaluation.   He also had a repeat head CT without contrast on 08/22/2013, which I reviewed: 1. No acute intracranial abnormalities. No change from the head CT and brain MRI dated 08/20/2013. 2. Small areas of calcification in the right frontal lobe corresponds to a venous angioma. This is a chronic finding. 3. Right cerebellar tonsillar ectopia, borderline Chiari 1 malformation.   In addition, personally reviewed the images through the PACS system. He takes Xanax as needed for anxiety. He does not take it every day. In fact, he may go days and sometimes weeks without taking it. He has a high stress job. He has to travel quite a bit especially in the first quarter of the year, usually nationwide. His bedtime varies but may be around 10 or 11 PM. Sometimes he works on his computer in bed. He does not sleep in the same bed with his wife. They have maintained separate bedrooms for years. His rise time is between 6 and 6:30. He wakes up adequately rested on most days. He denies restless leg symptoms, nighttime reflux symptoms or night to night nocturia. He is not known to twitch in his sleep. His snoring is probably mild to moderate. He is not sure if he stops breathing in his sleep. He has some daytime tiredness and his Epworth sleepiness score is 8 out of 24. He has 2 children, ages 37 and 39. He does not smoke. He drinks caffeine occasionally, not daily. He drinks alcohol maybe 2 or 3 times per week. He has a family history of obstructive sleep apnea in both his younger brothers. His father snored heavily. He died at the age of 45 secondary to suicide secondary to significant  depression. His mother is approximately 67 years old. He does not typically watch TV in bed. He denies parasomnias. He has no one-sided weakness, numbness, tingling or slurring of speech or droopy face. He does not have significant nausea or vomiting with his headaches. He does suffer from tension headaches for which he occasionally takes Flexeril. He was seen by a headache specialist as well and was prescribed medication but did not actually take it.    I reviewed your office note from 02/16/2015, which you kindly included. Also reviewed blood test results that were done in your office on 01/18/2015: He had a normal CBC with differential, normal CMP, and normal TSH. He also had blood work more recently and has an appointment for his yearly physical with you coming up.    His Past Medical History Is Significant For: Past Medical History:  Diagnosis Date  . Anxiety disorder   . Headache(784.0) 08/25/2013  . Venous angioma of brain ([place]) 08/25/2013   Right frontal    His Past Surgical History Is Significant For: No past surgical history  on file.  His Family History Is Significant For: Family History  Problem Relation Age of Onset  . Depression Father   . Congestive Heart Failure Maternal Grandmother   . Diabetes Maternal Grandfather   . Heart attack Maternal Grandfather   . Depression Paternal Grandfather     His Social History Is Significant For: Social History   Social History  . Marital status: Married    Spouse name: N/A  . Number of children: 2  . Years of education: college   Occupational History  . [employer]   Social History Main Topics  . Smoking status: Never Smoker  . Smokeless tobacco: Never Used  . Alcohol use 0.0 oz/week     Comment: 2x weekly  . Drug use: No  . Sexual activity: Not Asked   Other Topics Concern  . None   Social History Narrative   Occasional caffeine drinks    His Allergies Are:  No Known Allergies:   His Current Medications  Are:  Outpatient Encounter Prescriptions as of 06/13/2016  Medication Sig  . ALPRAZolam (XANAX) 0.25 MG tablet Take 1 tablet (0.25 mg total) by mouth 3 (three) times daily as needed for anxiety.  . methocarbamol (ROBAXIN) 500 MG tablet TK 1 T PO TID PRF MUSCLE INFLAMMATION   No facility-administered encounter medications on file as of 06/13/2016.   :  Review of Systems:  Out of a complete 14 point review of systems, all are reviewed and negative with the exception of these symptoms as listed below:  Review of Systems  Neurological:       Patient is c/o on and off headaches for past couple of month. He will have neck stiffness, numbness in L side of face and L eye pain. He has appt to start PT soon. Will also see ophthalmologist soon.   Patient reports that he never got an AutoPAP machine after sleep study due to financial reasons. He reports losing some weight and is exercising more.      Objective:  Neurologic Exam  Physical Exam Physical Examination:   Vitals:   06/13/16 1330  BP: 129/81  Pulse: 84  Resp: 18     General Examination: The patient is a very pleasant 50 y.o. male in no acute distress. He appears well-developed and well-nourished and well groomed.   HEENT: Normocephalic, atraumatic, pupils are equal, round and reactive to light and accommodation. Funduscopic exam is normal with sharp disc margins noted. Extraocular tracking is good without limitation to gaze excursion or nystagmus noted. Normal smooth pursuit is noted. Hearing is grossly intact. Face is symmetric with normal facial animation and normal facial sensation. Speech is clear with no dysarthria noted. There is no hypophonia. There is no lip, neck/head, jaw or voice tremor. Neck is supple with full range of passive and active motion. There are no carotid bruits on auscultation. Oropharynx exam reveals: mild mouth dryness, good dental hygiene and mild airway crowding, due to redundant soft palate and wider uvula  which is also somewhat elongated and floppy appearing.. Mallampati is class II. Tongue protrudes centrally and palate elevates symmetrically. Tonsils are small bilaterally.   Chest: Clear to auscultation without wheezing, rhonchi or crackles noted.  Heart: S1+S2+0, regular and normal without murmurs, rubs or gallops noted.   Abdomen: Soft, non-tender and non-distended with normal bowel sounds appreciated on auscultation.  Extremities: There is no pitting edema in the distal lower extremities bilaterally. Pedal pulses are intact.  Skin: Warm and dry without trophic changes  noted. There are no varicose veins.  Musculoskeletal: exam reveals no obvious joint deformities, tenderness or joint swelling or erythema.   Neurologically:  Mental status: The patient is awake, alert and oriented in all 4 spheres. His immediate and remote memory, attention, language skills and fund of knowledge are appropriate. There is no evidence of aphasia, agnosia, apraxia or anomia. Speech is clear with normal prosody and enunciation. Thought process is linear. Mood is normal and affect is normal.  Cranial nerves II - XII are as described above under HEENT exam. In addition: shoulder shrug is normal with equal shoulder height noted. Motor exam: Normal bulk, strength and tone is noted. There is no drift, tremor or rebound. Romberg is negative. Reflexes are 2+ throughout. Fine motor skills and coordination: intact with normal finger taps, normal hand movements, normal rapid alternating patting, normal foot taps and normal foot agility.  Cerebellar testing: No dysmetria or intention tremor on finger to nose testing. Heel to shin is unremarkable bilaterally. There is no truncal or gait ataxia.  Sensory exam: intact to light touch, pinprick, vibration, temperature sense in the upper and lower extremities.  Gait, station and balance: He stands easily. No veering to one side is noted. No leaning to one side is noted. Posture is  age-appropriate and stance is narrow based. Gait shows normal stride length and normal pace. No problems turning are noted. Tandem walk is unremarkable.   Assessment and Plan:   In summary, Robert Elliott. is a very pleasant 50 year old male with an underlying medical history of anxiety, venous anomaly of the brain, and recurrent headaches since May 2014, whom have previously seen for sleep apnea evaluation. He had a home sleep test which showed moderate obstructive sleep apnea, his insurance denied it in house sleep study and I suggested AutoPap therapy. The patient decided not to pursue it. He presents for neck pain, arm tingling on the left and recurrent headaches which is a worsening problem for him. 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 He has lost weight since his sleep test at home. Nevertheless, residual sleep apnea can be a contributor for recurrent headaches and I advised him that pursuing AutoPap therapy can still be an option for him. He tries to hydrate well, he has an appointment with ophthalmology and I agree that evaluation of his vision is important as well. From my end of things, I suggested a neck MRI. This was previously discussed with him about 3 years ago when he saw Dr. Jannifer Franklin. Physical exam and neurological exam are nonfocal and he is reassured. He had an MRI of the brain and MRA last year which I reviewed. I suggested he could go ahead and try the Robaxin that he has been prescribed. I did warn him about the potential sedating properties of any muscle relaxer. Is advised not to drive after taking Robaxin until he knows how it affects him. He has to drive longer distances for work. He is furthermore advised to perhaps scaled down his brisk walk from 1-1/2 hours to maybe 45 minutes to an hour at a time.  We may consider trying another headache prevention medication down the road. He has also  seen Dr. Melton Alar in the past and was tried on some medication but does not recall the name or names. I have asked him to see if he can get records and find out from his pharmacy what he may  have tried in the past so we do not duplicate things. I will see him back routinely in 3 months, we will call him with his neck MRI results. I answered all his questions today and he was in agreement.   Thank you very much for allowing me to participate in the care of this nice patient. If I can be of any further assistance to you please do not hesitate to call me at [phone].  Sincerely,   Star Age, MD, PhD

## 2016-06-26 ENCOUNTER — Other Ambulatory Visit: Payer: Self-pay

## 2016-07-04 ENCOUNTER — Ambulatory Visit: Payer: Self-pay | Admitting: Neurology

## 2016-07-05 ENCOUNTER — Other Ambulatory Visit: Payer: Self-pay

## 2016-07-06 ENCOUNTER — Inpatient Hospital Stay: Admission: RE | Admit: 2016-07-06 | Payer: Self-pay | Source: Ambulatory Visit

## 2016-07-13 DIAGNOSIS — H5203 Hypermetropia, bilateral: Secondary | ICD-10-CM | POA: Diagnosis not present

## 2016-07-13 DIAGNOSIS — R51 Headache: Secondary | ICD-10-CM | POA: Diagnosis not present

## 2016-07-17 ENCOUNTER — Ambulatory Visit
Admission: RE | Admit: 2016-07-17 | Discharge: 2016-07-17 | Disposition: A | Discharge status: Home / Self Care | Payer: BLUE CROSS/BLUE SHIELD | Source: Ambulatory Visit | Attending: Neurology | Admitting: Neurology

## 2016-07-17 DIAGNOSIS — R202 Paresthesia of skin: Secondary | ICD-10-CM

## 2016-07-17 DIAGNOSIS — R519 Headache, unspecified: Secondary | ICD-10-CM

## 2016-07-17 DIAGNOSIS — G4486 Cervicogenic headache: Secondary | ICD-10-CM

## 2016-07-17 DIAGNOSIS — R51 Headache: Principal | ICD-10-CM

## 2016-07-24 ENCOUNTER — Other Ambulatory Visit: Payer: Self-pay

## 2016-08-01 ENCOUNTER — Inpatient Hospital Stay: Admission: RE | Admit: 2016-08-01 | Payer: Self-pay | Source: Ambulatory Visit

## 2016-08-20 ENCOUNTER — Encounter: Payer: Self-pay | Admitting: Neurology

## 2016-08-20 ENCOUNTER — Telehealth: Payer: Self-pay

## 2016-08-20 NOTE — Telephone Encounter (Signed)
Can you request MRI report? I don't see it in the chart. thx

## 2016-08-20 NOTE — Telephone Encounter (Signed)
I called [employer] and requested report to be faxed to [place].

## 2016-08-20 NOTE — Telephone Encounter (Signed)
Patient called in and said that he never received MRI results from 9/5. Have you seen these?

## 2016-08-20 NOTE — Telephone Encounter (Signed)
I received fax from [facility] that says See progress note. No other notes with it. I called back and left a message with medical records to fax [place] the report.

## 2016-08-23 NOTE — Telephone Encounter (Signed)
I called patient and got his vm, left message per DPR with results below  I have looked at the images and it shows only minor disc degen changes without compression.The report got inadvertently locked by [facility] radiology hence we are unable to generate formal report till epic people unlock it Kindly let patient know  Robert Elliott  I advised what had happened to his MRI report. Left our call back number for any further questions.

## 2016-08-23 NOTE — Telephone Encounter (Signed)
Dr. Leonie Man has read the MRI, not in Epic yet. [facility] has closed the report and IT will have to open it for [place].

## 2016-08-23 NOTE — Telephone Encounter (Signed)
I am in contact with our clinical coordinator about getting results.

## 2016-08-24 NOTE — Progress Notes (Signed)
Please call patient re: C spine MRI: Slightly abnormal MRI scan cervical spine with loss of natural curvature and minor disc signal abnormalities at C3-4 but without cord compression or significant signs of nerve root compression ("pinched nerve"). No further action required.   Star Age, MD, PhD [facility])

## 2016-08-29 ENCOUNTER — Ambulatory Visit: Payer: Self-pay | Admitting: Neurology

## 2016-09-12 ENCOUNTER — Ambulatory Visit: Payer: BLUE CROSS/BLUE SHIELD | Admitting: Neurology

## 2016-09-18 ENCOUNTER — Ambulatory Visit: Payer: BLUE CROSS/BLUE SHIELD | Admitting: Neurology

## 2016-11-21 ENCOUNTER — Ambulatory Visit: Payer: Self-pay | Admitting: Neurology

## 2016-11-29 ENCOUNTER — Ambulatory Visit: Payer: BLUE CROSS/BLUE SHIELD | Admitting: Neurology

## 2016-11-30 ENCOUNTER — Telehealth: Payer: Self-pay

## 2016-11-30 NOTE — Telephone Encounter (Signed)
I called the patient, from home on Wednesday (1/17), and LM that the office is closed and we will call back to reschedule (due to the snow).

## 2016-11-30 NOTE — Telephone Encounter (Signed)
I spoke to Robert Elliott and was able to get him r/s to February.

## 2016-12-25 ENCOUNTER — Ambulatory Visit: Payer: Self-pay | Admitting: Neurology

## 2016-12-28 ENCOUNTER — Encounter: Payer: Self-pay | Admitting: Neurology

## 2016-12-28 DIAGNOSIS — R309 Painful micturition, unspecified: Secondary | ICD-10-CM | POA: Diagnosis not present

## 2017-02-18 DIAGNOSIS — S29011A Strain of muscle and tendon of front wall of thorax, initial encounter: Secondary | ICD-10-CM | POA: Diagnosis not present

## 2017-05-24 DIAGNOSIS — Z0000 Encounter for general adult medical examination without abnormal findings: Secondary | ICD-10-CM | POA: Diagnosis not present

## 2017-05-24 DIAGNOSIS — Z125 Encounter for screening for malignant neoplasm of prostate: Secondary | ICD-10-CM | POA: Diagnosis not present

## 2017-05-27 DIAGNOSIS — F418 Other specified anxiety disorders: Secondary | ICD-10-CM | POA: Diagnosis not present

## 2017-05-27 DIAGNOSIS — J302 Other seasonal allergic rhinitis: Secondary | ICD-10-CM | POA: Diagnosis not present

## 2017-05-27 DIAGNOSIS — G44209 Tension-type headache, unspecified, not intractable: Secondary | ICD-10-CM | POA: Diagnosis not present

## 2017-05-27 DIAGNOSIS — Z0000 Encounter for general adult medical examination without abnormal findings: Secondary | ICD-10-CM | POA: Diagnosis not present

## 2017-05-27 DIAGNOSIS — Z125 Encounter for screening for malignant neoplasm of prostate: Secondary | ICD-10-CM | POA: Diagnosis not present

## 2017-05-27 DIAGNOSIS — D1800 Hemangioma unspecified site: Secondary | ICD-10-CM | POA: Diagnosis not present

## 2017-05-27 DIAGNOSIS — Z1389 Encounter for screening for other disorder: Secondary | ICD-10-CM | POA: Diagnosis not present

## 2017-05-27 DIAGNOSIS — Z1212 Encounter for screening for malignant neoplasm of rectum: Secondary | ICD-10-CM | POA: Diagnosis not present

## 2017-05-30 ENCOUNTER — Encounter: Payer: Self-pay | Admitting: Gastroenterology

## 2017-05-30 LAB — IFOBT (OCCULT BLOOD): IMMUNOLOGICAL FECAL OCCULT BLOOD TEST: POSITIVE

## 2017-07-17 ENCOUNTER — Encounter: Payer: Self-pay | Admitting: Gastroenterology

## 2017-07-17 ENCOUNTER — Ambulatory Visit (INDEPENDENT_AMBULATORY_CARE_PROVIDER_SITE_OTHER): Payer: BLUE CROSS/BLUE SHIELD | Admitting: Gastroenterology

## 2017-07-17 VITALS — BP 106/60 | HR 80 | Ht 75.0 in | Wt 213.0 lb

## 2017-07-17 DIAGNOSIS — R195 Other fecal abnormalities: Secondary | ICD-10-CM

## 2017-07-17 MED ORDER — NA SULFATE-K SULFATE-MG SULF 17.5-3.13-1.6 GM/177ML PO SOLN: 1.0000 | Freq: Once | ORAL | 0 refills | Status: AC

## 2017-07-17 NOTE — Progress Notes (Signed)
[facility] Consult Note:  History: Robert Elliott. 07/17/2017  Referring physician: Marton Redwood, MD  Reason for consult/chief complaint: Blood In Stools (positive hemocult. Has never seen any blood after a bowel movement. Has hx of hemorhoids. )   Subjective  HPI:  51 yo man referred by Dr. Brigitte Pulse at [facility] for heme positive stool. About 6 months ago he had a brief period of painless rectal bleeding that he attributed to hemorrhoids. He has had no further problems with overt bleeding since then. Robert Elliott reports that his bowel habits are regular, he does not a chronic abdominal pain, and he has no upper digestive symptoms such as dysphagia, odynophagia, frequent heartburn, nausea, vomiting, early satiety, He had positive stool cards done at home after a physical in July.   ROS:  Review of Systems Chronic anxiety Denies chest pain, dyspnea or dysuria  Past Medical History: Past Medical History:  Diagnosis Date  . Anxiety disorder   . Basal cell carcinoma   . Headache(784.0) 08/25/2013  . Venous angioma of brain ([place]) 08/25/2013   Right frontal     Past Surgical History: Past Surgical History:  Procedure Laterality Date  . SKIN CANCER EXCISION       Family History: Family History  Problem Relation Age of Onset  . Depression Father   . Colon polyps Father   . Congestive Heart Failure Maternal Grandmother   . Diabetes Maternal Grandfather   . Heart attack Maternal Grandfather   . Depression Paternal Grandfather     Social History: Social History   Social History  . Marital status: Married    Spouse name: N/A  . Number of children: 2  . Years of education: college   Occupational History  . [employer]   Social History Main Topics  . Smoking status: Never Smoker  . Smokeless tobacco: Never Used  . Alcohol use 2.4 oz/week    4 Shots of liquor per week     Comment: 2x weekly  . Drug use: No  . Sexual  activity: Yes    Partners: Female   Other Topics Concern  . None   Social History Narrative   Occasional caffeine drinks   Travels a lot for sales in [employer]  Allergies: No Known Allergies  Outpatient Meds: Current Outpatient Prescriptions  Medication Sig Dispense Refill  . ALPRAZolam (XANAX) 0.25 MG tablet Take 1 tablet (0.25 mg total) by mouth 3 (three) times daily as needed for anxiety. (Patient not taking: Reported on 07/17/2017) 12 tablet 0  . methocarbamol (ROBAXIN) 500 MG tablet TK 1 T PO TID PRF MUSCLE INFLAMMATION  0  . Na Sulfate-K Sulfate-Mg Sulf 17.5-3.13-1.6 GM/180ML SOLN Take 1 kit by mouth once. 354 mL 0   No current facility-administered medications for this visit.       ___________________________________________________________________ Objective   Exam:  BP 106/60   Pulse 80   Ht _0  (1.905 m)   Wt 213 lb (96.6 kg)   BMI 26.62 kg/m    General: this is a(n) well-appearing man   Eyes: sclera anicteric, no redness  ENT: oral mucosa moist without lesions, no cervical or supraclavicular lymphadenopathy, good dentition  CV: RRR without murmur, S1/S2, no JVD, no peripheral edema  Resp: clear to auscultation bilaterally, normal RR and effort noted  GI: soft, no tenderness, with active bowel sounds. No guarding or palpable organomegaly noted.  Skin; warm and dry, no rash or jaundice noted  Neuro: awake, alert and oriented x 3. Normal gross motor function and fluent speech  Labs:  Normal CBC/CMP in July   Assessment: Encounter Diagnosis  Name Primary?  . Heme positive stool Yes    Previous brief rectal bleeding, currently asymptomatic. I advised to have a colonoscopy to rule out colon polyps and cancer, and he is agreeable after a thorough discussion of the procedure and risks.   Plan:  Colonoscopy. He specifically asked if it would be a screening or diagnostic procedure, and I informed him  it would be diagnostic.  Thank you for the courtesy of this consult.  Please call me with any questions or concerns.  Nelida Meuse III  CC: Marton Redwood, MD

## 2017-07-17 NOTE — Patient Instructions (Signed)
If you are age 49 or older, your body mass index should be between 23-30. Your Body mass index is 26.62 kg/m. If this is out of the aforementioned range listed, please consider follow up with your Primary Care Provider.  If you are age 59 or younger, your body mass index should be between 19-25. Your Body mass index is 26.62 kg/m. If this is out of the aformentioned range listed, please consider follow up with your Primary Care Provider.   You have been scheduled for a colonoscopy. Please follow written instructions given to you at your visit today.  Please pick up your prep supplies at the pharmacy within the next 1-3 days. If you use inhalers (even only as needed), please bring them with you on the day of your procedure. Your physician has requested that you go to www.startemmi.com and enter the access code given to you at your visit today. This web site gives a general overview about your procedure. However, you should still follow specific instructions given to you by our office regarding your preparation for the procedure.  Thank you for choosing [facility]  Dr Wilfrid Lund III

## 2017-07-25 ENCOUNTER — Encounter: Payer: Self-pay | Admitting: Gastroenterology

## 2017-07-29 ENCOUNTER — Telehealth: Payer: Self-pay | Admitting: Gastroenterology

## 2017-07-29 NOTE — Telephone Encounter (Signed)
Coupon sent to pts pharmacy on file.

## 2017-08-06 ENCOUNTER — Ambulatory Visit (AMBULATORY_SURGERY_CENTER): Payer: BLUE CROSS/BLUE SHIELD | Admitting: Gastroenterology

## 2017-08-06 ENCOUNTER — Encounter: Payer: Self-pay | Admitting: Gastroenterology

## 2017-08-06 VITALS — BP 103/66 | HR 69 | Temp 98.0°F | Resp 13 | Ht 75.0 in | Wt 213.0 lb

## 2017-08-06 DIAGNOSIS — R195 Other fecal abnormalities: Secondary | ICD-10-CM

## 2017-08-06 DIAGNOSIS — D124 Benign neoplasm of descending colon: Secondary | ICD-10-CM | POA: Diagnosis not present

## 2017-08-06 DIAGNOSIS — D123 Benign neoplasm of transverse colon: Secondary | ICD-10-CM | POA: Diagnosis not present

## 2017-08-06 DIAGNOSIS — D122 Benign neoplasm of ascending colon: Secondary | ICD-10-CM

## 2017-08-06 MED ORDER — SODIUM CHLORIDE 0.9 % IV SOLN: 500.0000 mL | INTRAVENOUS | Status: AC

## 2017-08-06 NOTE — Progress Notes (Signed)
Called to room to assist during endoscopic procedure.  Patient ID and intended procedure confirmed with present staff. Received instructions for my participation in the procedure from the performing physician.  

## 2017-08-06 NOTE — Op Note (Signed)
[facility] Patient Name: Robert Elliott Procedure Date: 08/06/2017 1:12 PM MRN: [id number] Endoscopist: Mallie Mussel L. Loletha Carrow , MD Age: 51 Referring MD:  Date of Birth: 1966-04-20 Gender: Male Account #: [id number] Procedure:                Colonoscopy Indications:              Heme positive stool Medicines:                Monitored Anesthesia Care Procedure:                Pre-Anesthesia Assessment:                           - Prior to the procedure, a History and Physical                            was performed, and patient medications and                            allergies were reviewed. The patient's tolerance of                            previous anesthesia was also reviewed. The risks                            and benefits of the procedure and the sedation                            options and risks were discussed with the patient.                            All questions were answered, and informed consent                            was obtained. Prior Anticoagulants: The patient has                            taken no previous anticoagulant or antiplatelet                            agents. ASA Grade Assessment: II - A patient with                            mild systemic disease. After reviewing the risks                            and benefits, the patient was deemed in                            satisfactory condition to undergo the procedure.                           After obtaining informed consent, the colonoscope  was passed under direct vision. Throughout the                            procedure, the patient's blood pressure, pulse, and                            oxygen saturations were monitored continuously. The                            Model CF-HQ190L [id number]) scope was introduced                            through the anus and advanced to the the cecum,                            identified by appendiceal orifice and  ileocecal                            valve. The colonoscopy was performed without                            difficulty. The patient tolerated the procedure                            well. The quality of the bowel preparation was                            good. The ileocecal valve, appendiceal orifice, and                            rectum were photographed. The quality of the bowel                            preparation was evaluated using the BBPS [facility]                            Bowel Preparation Scale) with scores of: Right                            Colon = 2, Transverse Colon = 2 and Left Colon = 2.                            The total BBPS score equals 6. The bowel                            preparation used was SUPREP. Scope In: 1:28:00 PM Scope Out: 1:43:00 PM Scope Withdrawal Time: 0 hours 11 minutes 30 seconds  Total Procedure Duration: 0 hours 15 minutes 0 seconds  Findings:                 The perianal and digital rectal examinations were                            normal.  Two semi-pedunculated polyps were found in the                            transverse colon and ascending colon. The polyps                            were 6 to 8 mm in size. These polyps were removed                            with a hot snare. Resection and retrieval were                            complete.                           A 3 mm polyp was found in the descending colon. The                            polyp was sessile. The polyp was removed with a                            cold snare. Resection and retrieval were complete.                           The exam was otherwise without abnormality on                            direct and retroflexion views. Complications:            No immediate complications. Estimated Blood Loss:     Estimated blood loss: none. Impression:               - Two 6 to 8 mm polyps in the transverse colon and                            in  the ascending colon, removed with a hot snare.                            Resected and retrieved.                           - One 3 mm polyp in the descending colon, removed                            with a cold snare. Resected and retrieved.                           - The examination was otherwise normal on direct                            and retroflexion views.                           Apparent false positive stool heme test in  a                            patient with normal hemoglobin and no chronic                            digestive symptoms. Recommendation:           - Patient has a contact number available for                            emergencies. The signs and symptoms of potential                            delayed complications were discussed with the                            patient. Return to normal activities tomorrow.                            Written discharge instructions were provided to the                            patient.                           - Resume previous diet.                           - Continue present medications.                           - Await pathology results.                           - Repeat colonoscopy is recommended for                            surveillance. The colonoscopy date will be                            determined after pathology results from today's                            exam become available for review. Henry L. Loletha Carrow, MD 08/06/2017 1:47:37 PM This report has been signed electronically.

## 2017-08-06 NOTE — Patient Instructions (Signed)
YOU HAD AN ENDOSCOPIC PROCEDURE TODAY AT THE [facility]:   Refer to the procedure report that was given to you for any specific questions about what was found during the examination.  If the procedure report does not answer your questions, please call your gastroenterologist to clarify.  If you requested that your care partner not be given the details of your procedure findings, then the procedure report has been included in a sealed envelope for you to review at your convenience later.  YOU SHOULD EXPECT: Some feelings of bloating in the abdomen. Passage of more gas than usual.  Walking can help get rid of the air that was put into your GI tract during the procedure and reduce the bloating. If you had a lower endoscopy (such as a colonoscopy or flexible sigmoidoscopy) you may notice spotting of blood in your stool or on the toilet paper. If you underwent a bowel prep for your procedure, you may not have a normal bowel movement for a few days.  Please Note:  You might notice some irritation and congestion in your nose or some drainage.  This is from the oxygen used during your procedure.  There is no need for concern and it should clear up in a day or so.  SYMPTOMS TO REPORT IMMEDIATELY:   Following lower endoscopy (colonoscopy or flexible sigmoidoscopy):  Excessive amounts of blood in the stool  Significant tenderness or worsening of abdominal pains  Swelling of the abdomen that is new, acute  Fever of 100F or higher   For urgent or emergent issues, a gastroenterologist can be reached at any hour by calling [phone].   DIET:  We do recommend a small meal at first, but then you may proceed to your regular diet.  Drink plenty of fluids but you should avoid alcoholic beverages for 24 hours.  ACTIVITY:  You should plan to take it easy for the rest of today and you should NOT DRIVE or use heavy machinery until tomorrow (because of the sedation medicines used during the test).     FOLLOW UP: Our staff will call the number listed on your records the next business day following your procedure to check on you and address any questions or concerns that you may have regarding the information given to you following your procedure. If we do not reach you, we will leave a message.  However, if you are feeling well and you are not experiencing any problems, there is no need to return our call.  We will assume that you have returned to your regular daily activities without incident.  If any biopsies were taken you will be contacted by phone or by letter within the next 1-3 weeks.  Please call us at [phone] if you have not heard about the biopsies in 3 weeks.    SIGNATURES/CONFIDENTIALITY: You and/or your care partner have signed paperwork which will be entered into your electronic medical record.  These signatures attest to the fact that that the information above on your After Visit Summary has been reviewed and is understood.  Full responsibility of the confidentiality of this discharge information lies with you and/or your care-partner.  Read all of the handouts given to you by your recovery room nurse. 

## 2017-08-06 NOTE — Progress Notes (Signed)
Spontaneous respirations throughout. VSS. Resting comfortably. To PACU on room air. Report to  RN. 

## 2017-08-07 ENCOUNTER — Telehealth: Payer: Self-pay | Admitting: *Deleted

## 2017-08-07 NOTE — Telephone Encounter (Signed)
Left message on f/u call 

## 2017-08-07 NOTE — Telephone Encounter (Signed)
  Follow up Call-  Call back number 08/06/2017  Post procedure Call Back phone  # [phone]  Permission to leave phone message Yes  Some recent data might be hidden     Patient questions:  Do you have a fever, pain , or abdominal swelling? No. Pain Score  0 *  Have you tolerated food without any problems? Yes.    Have you been able to return to your normal activities? Yes.    Do you have any questions about your discharge instructions: Diet   No. Medications  No. Follow up visit  No.  Do you have questions or concerns about your Care? No.  Actions: * If pain score is 4 or above: No action needed, pain <4.  Pt. Wanted to know how he would receive results from polyps,made him aware that he would be receiving a letter once results come back from pathology,pt. Verbalize understanding.

## 2017-08-12 ENCOUNTER — Encounter: Payer: Self-pay | Admitting: Gastroenterology

## 2017-12-13 DIAGNOSIS — Z6826 Body mass index (BMI) 26.0-26.9, adult: Secondary | ICD-10-CM | POA: Diagnosis not present

## 2017-12-13 DIAGNOSIS — R109 Unspecified abdominal pain: Secondary | ICD-10-CM | POA: Diagnosis not present

## 2017-12-13 DIAGNOSIS — K5900 Constipation, unspecified: Secondary | ICD-10-CM | POA: Diagnosis not present

## 2017-12-13 DIAGNOSIS — R030 Elevated blood-pressure reading, without diagnosis of hypertension: Secondary | ICD-10-CM | POA: Diagnosis not present

## 2018-06-02 DIAGNOSIS — Z125 Encounter for screening for malignant neoplasm of prostate: Secondary | ICD-10-CM | POA: Diagnosis not present

## 2018-06-02 DIAGNOSIS — Z0000 Encounter for general adult medical examination without abnormal findings: Secondary | ICD-10-CM | POA: Diagnosis not present

## 2018-06-09 DIAGNOSIS — R030 Elevated blood-pressure reading, without diagnosis of hypertension: Secondary | ICD-10-CM | POA: Diagnosis not present

## 2018-06-09 DIAGNOSIS — F418 Other specified anxiety disorders: Secondary | ICD-10-CM | POA: Diagnosis not present

## 2018-06-09 DIAGNOSIS — Z0000 Encounter for general adult medical examination without abnormal findings: Secondary | ICD-10-CM | POA: Diagnosis not present

## 2018-06-09 DIAGNOSIS — Z1389 Encounter for screening for other disorder: Secondary | ICD-10-CM | POA: Diagnosis not present

## 2018-06-09 DIAGNOSIS — J302 Other seasonal allergic rhinitis: Secondary | ICD-10-CM | POA: Diagnosis not present

## 2018-06-13 DIAGNOSIS — Z1212 Encounter for screening for malignant neoplasm of rectum: Secondary | ICD-10-CM | POA: Diagnosis not present

## 2018-08-01 DIAGNOSIS — H01001 Unspecified blepharitis right upper eyelid: Secondary | ICD-10-CM | POA: Diagnosis not present

## 2018-08-01 DIAGNOSIS — H01004 Unspecified blepharitis left upper eyelid: Secondary | ICD-10-CM | POA: Diagnosis not present

## 2018-08-01 DIAGNOSIS — H5203 Hypermetropia, bilateral: Secondary | ICD-10-CM | POA: Diagnosis not present

## 2018-08-01 DIAGNOSIS — H524 Presbyopia: Secondary | ICD-10-CM | POA: Diagnosis not present

## 2019-06-05 DIAGNOSIS — Z125 Encounter for screening for malignant neoplasm of prostate: Secondary | ICD-10-CM | POA: Diagnosis not present

## 2019-06-05 DIAGNOSIS — Z0000 Encounter for general adult medical examination without abnormal findings: Secondary | ICD-10-CM | POA: Diagnosis not present

## 2019-06-15 DIAGNOSIS — F419 Anxiety disorder, unspecified: Secondary | ICD-10-CM | POA: Diagnosis not present

## 2019-06-15 DIAGNOSIS — Z86010 Personal history of colonic polyps: Secondary | ICD-10-CM | POA: Diagnosis not present

## 2019-06-15 DIAGNOSIS — I10 Essential (primary) hypertension: Secondary | ICD-10-CM | POA: Diagnosis not present

## 2019-06-15 DIAGNOSIS — E785 Hyperlipidemia, unspecified: Secondary | ICD-10-CM | POA: Diagnosis not present

## 2019-06-15 DIAGNOSIS — Z1331 Encounter for screening for depression: Secondary | ICD-10-CM | POA: Diagnosis not present

## 2019-06-15 DIAGNOSIS — Z0000 Encounter for general adult medical examination without abnormal findings: Secondary | ICD-10-CM | POA: Diagnosis not present

## 2019-06-18 ENCOUNTER — Other Ambulatory Visit: Payer: Self-pay | Admitting: Internal Medicine

## 2019-06-18 DIAGNOSIS — E785 Hyperlipidemia, unspecified: Secondary | ICD-10-CM

## 2019-06-30 ENCOUNTER — Other Ambulatory Visit: Payer: BLUE CROSS/BLUE SHIELD

## 2019-07-16 ENCOUNTER — Ambulatory Visit
Admission: RE | Admit: 2019-07-16 | Discharge: 2019-07-16 | Disposition: A | Discharge status: Home / Self Care | Payer: BC Managed Care – PPO | Source: Ambulatory Visit | Attending: Internal Medicine | Admitting: Internal Medicine

## 2019-07-16 DIAGNOSIS — E785 Hyperlipidemia, unspecified: Secondary | ICD-10-CM

## 2020-01-22 IMAGING — CT CT HEART SCORING
3 series · 14 of 20 positions shown, 16 images · non-contrast
Comparison: CTA 07/06/2011

CLINICAL DATA: 52-year-old male.  Hyperlipidemia.

EXAM:
CT HEART FOR CALCIUM SCORING
TECHNIQUE: CT heart was performed on a 256 channel system using prospective ECG
gating. A scout and noncontrast exam (for calcium scoring) were
performed. Note that this exam targets the heart and the chest was
not imaged in its entirety.

[Series 2: calcium scoring 2.00 qr36 bestdiast 69% · axial · 0.36mm/px · z∈[+1616,+1698]mm · 4 of 69 slices shown]
[im 14/69  vessel]
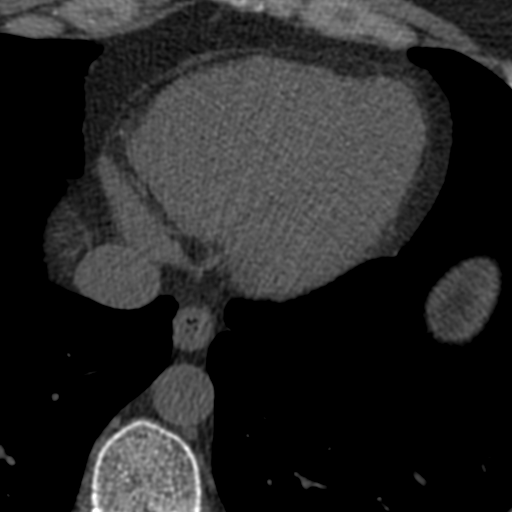
[im 28/69  vessel]
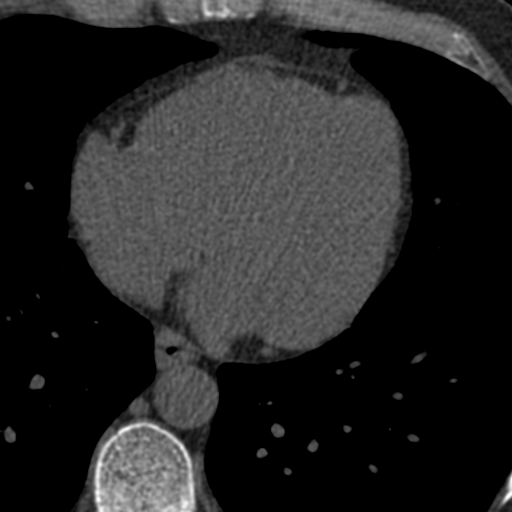
[im 41/69  vessel]
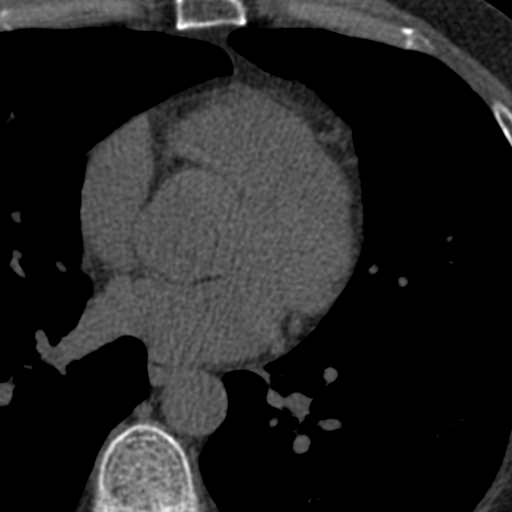
[im 55/69  vessel]
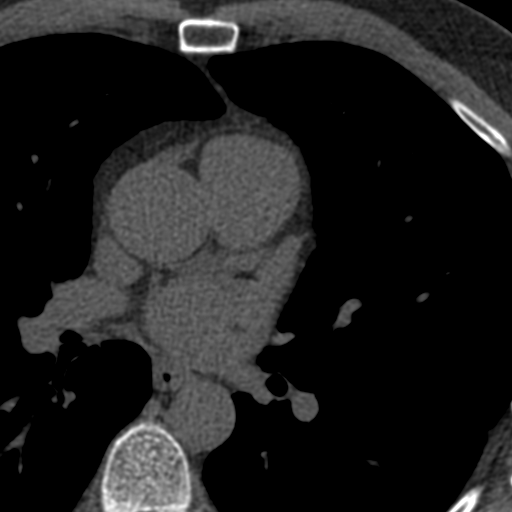

[Series 3: calcium scoring 2.00 br40 bestdiast 69% fov · axial · 0.70mm/px · z∈[+1610,+1702]mm · 5 of 70 slices shown, 7 images]
[im 12/70  vessel]
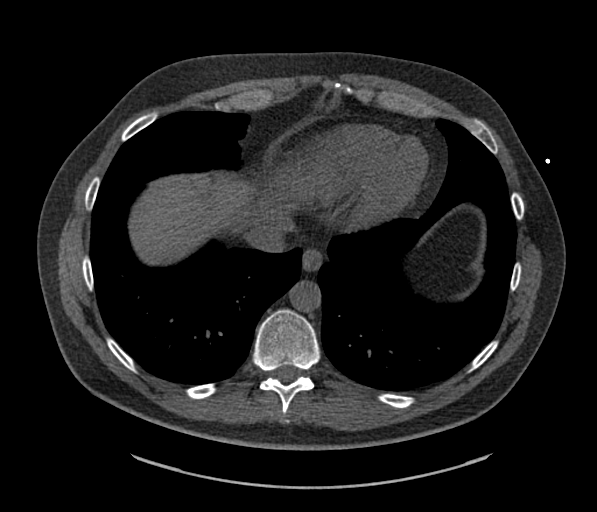
[im 12/70  lung]
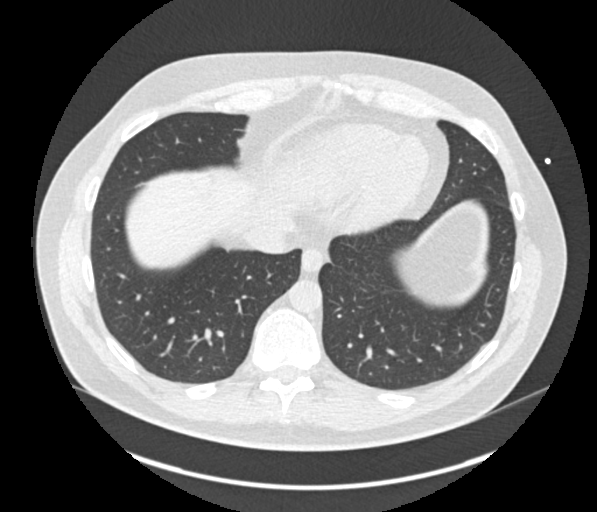
[im 24/70  vessel]
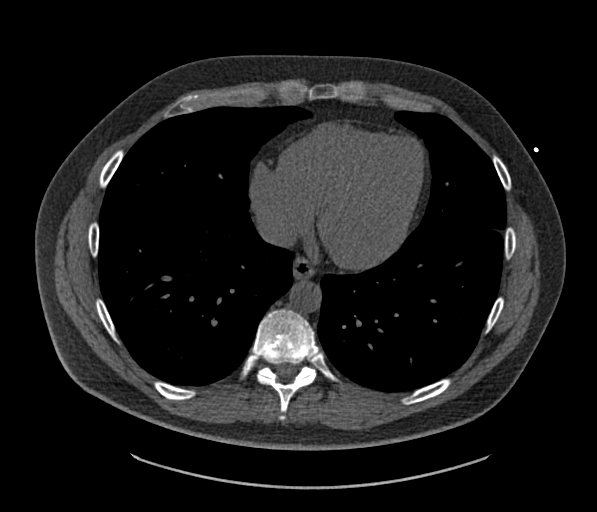
[im 35/70  vessel]
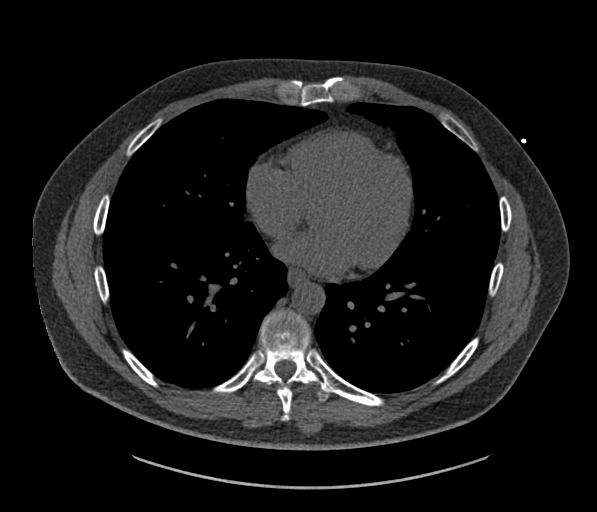
[im 47/70  vessel]
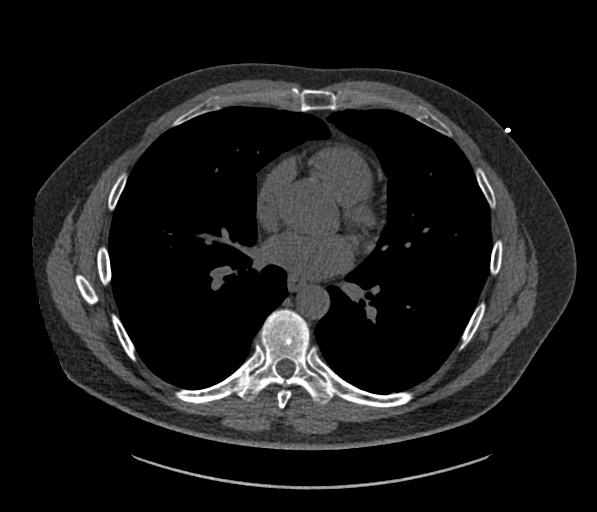
[im 58/70  vessel]
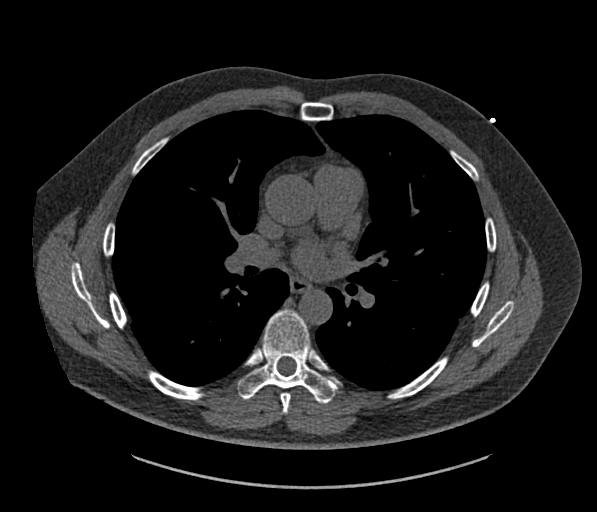
[im 58/70  lung]
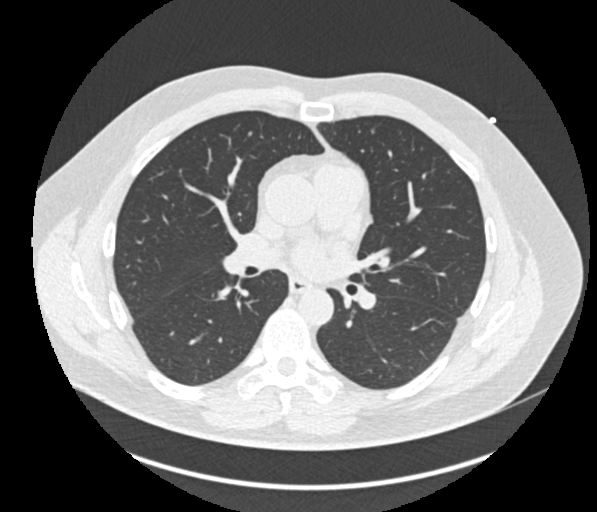

[Series 9: calcium scoring 2.00 br60 bestdiast 69% fov · axial · 0.65mm/px · z∈[+1612,+1702]mm · 5 of 69 slices shown]
[im 12/69  vessel]
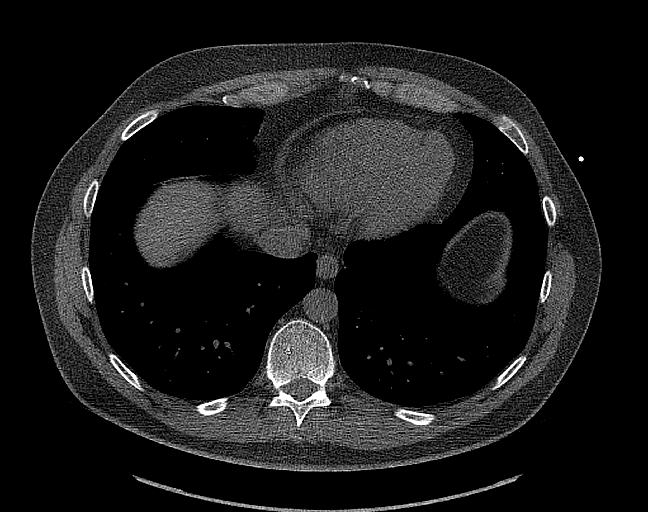
[im 23/69  vessel]
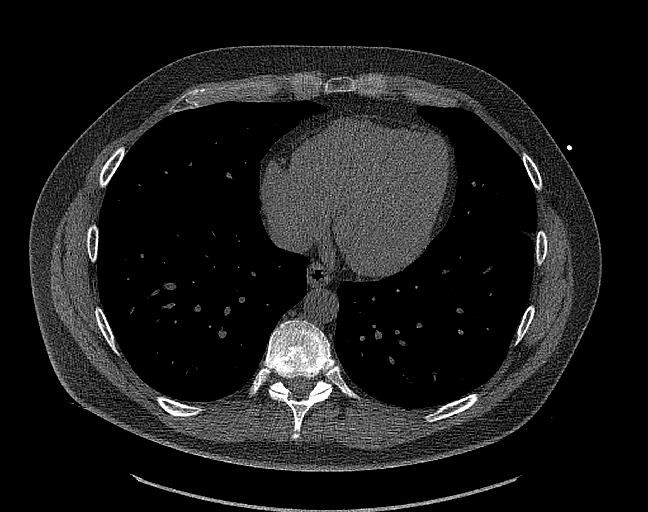
[im 35/69  vessel]
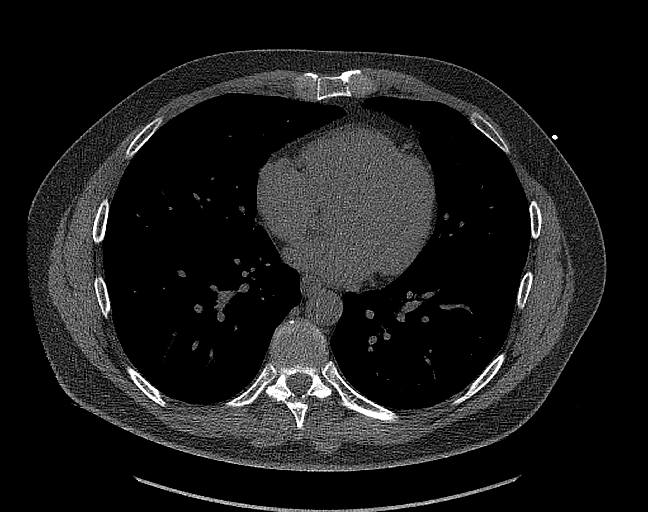
[im 46/69  vessel]
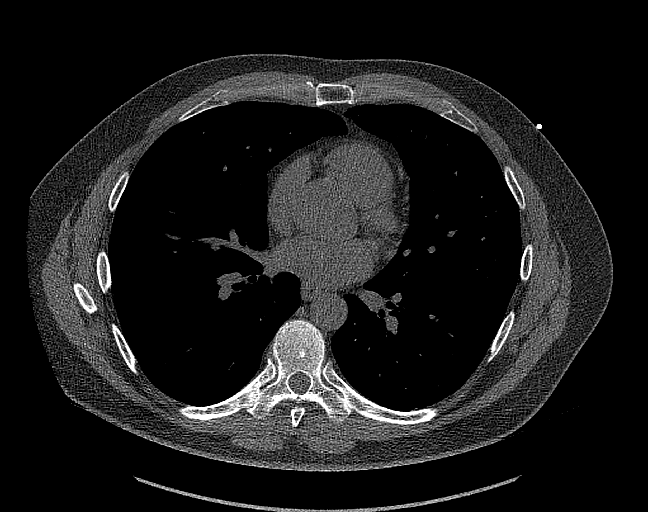
[im 57/69  vessel]
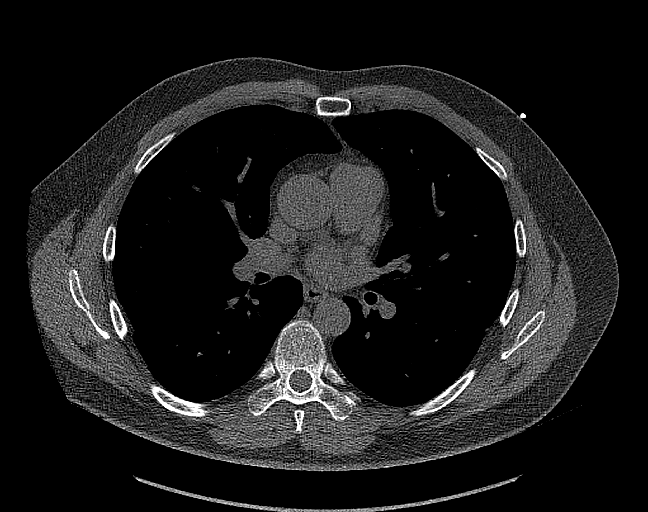

[14 of 20 positions shown; findings below may reference images not displayed]

FINDINGS: Technical quality: Good

LEFT main and LAD coronary artery calcification.

CORONARY CALCIUM

Total Agatston Score:

[HOSPITAL] percentile: 77 th

Ascending aorta ( <  40 mm): 35 mm

EXTRACARDIAC FINDINGS:

Limited view of the lung parenchyma demonstrates no suspicious
nodularity. Airways are normal.

Limited view of the mediastinum demonstrates no adenopathy.
Esophagus normal.

Limited view of the upper abdomen unremarkable.

Limited view of the skeleton and chest wall is unremarkable.
IMPRESSION: 1.  Mild coronary artery calcification.

2. Total Agatston Score:

3. MESA age and sex matched database percentile: 77th

## 2023-08-01 DIAGNOSIS — Z125 Encounter for screening for malignant neoplasm of prostate: Secondary | ICD-10-CM | POA: Diagnosis not present

## 2023-08-01 DIAGNOSIS — R7301 Impaired fasting glucose: Secondary | ICD-10-CM | POA: Diagnosis not present

## 2023-08-01 DIAGNOSIS — E785 Hyperlipidemia, unspecified: Secondary | ICD-10-CM | POA: Diagnosis not present

## 2023-08-01 DIAGNOSIS — I10 Essential (primary) hypertension: Secondary | ICD-10-CM | POA: Diagnosis not present

## 2023-08-05 DIAGNOSIS — R82998 Other abnormal findings in urine: Secondary | ICD-10-CM | POA: Diagnosis not present

## 2024-08-25 DIAGNOSIS — Z0189 Encounter for other specified special examinations: Secondary | ICD-10-CM | POA: Diagnosis not present

## 2024-08-25 DIAGNOSIS — Z125 Encounter for screening for malignant neoplasm of prostate: Secondary | ICD-10-CM | POA: Diagnosis not present

## 2024-10-19 DIAGNOSIS — R0683 Snoring: Secondary | ICD-10-CM | POA: Diagnosis not present
